# Patient Record
Sex: Female | Born: 1945 | Race: White | Hispanic: No | Marital: Married | Smoking: Never smoker
Health system: Southern US, Community
[De-identification: ages and names within clinical notes are randomized; demographics above are authoritative.]

## PROBLEM LIST (undated history)

## (undated) DIAGNOSIS — H3530 Unspecified macular degeneration: Secondary | ICD-10-CM

## (undated) DIAGNOSIS — N814 Uterovaginal prolapse, unspecified: Secondary | ICD-10-CM

## (undated) DIAGNOSIS — Z87442 Personal history of urinary calculi: Secondary | ICD-10-CM

---

## 1982-02-11 HISTORY — PX: TUBAL LIGATION: SHX77

## 1998-04-04 ENCOUNTER — Other Ambulatory Visit: Admission: RE | Admit: 1998-04-04 | Discharge: 1998-04-04 | Payer: Self-pay | Admitting: Obstetrics and Gynecology

## 1999-08-01 ENCOUNTER — Other Ambulatory Visit: Admission: RE | Admit: 1999-08-01 | Discharge: 1999-08-01 | Payer: Self-pay | Admitting: Gynecology

## 2000-10-20 ENCOUNTER — Other Ambulatory Visit: Admission: RE | Admit: 2000-10-20 | Discharge: 2000-10-20 | Payer: Self-pay | Admitting: Obstetrics and Gynecology

## 2002-08-05 ENCOUNTER — Other Ambulatory Visit: Admission: RE | Admit: 2002-08-05 | Discharge: 2002-08-05 | Payer: Self-pay | Admitting: Obstetrics and Gynecology

## 2004-03-19 ENCOUNTER — Other Ambulatory Visit: Admission: RE | Admit: 2004-03-19 | Discharge: 2004-03-19 | Payer: Self-pay | Admitting: Obstetrics and Gynecology

## 2005-02-11 HISTORY — PX: BREAST ENHANCEMENT SURGERY: SHX7

## 2005-02-11 HISTORY — PX: VAGINAL HYSTERECTOMY: SUR661

## 2005-09-02 ENCOUNTER — Encounter (INDEPENDENT_AMBULATORY_CARE_PROVIDER_SITE_OTHER): Payer: Self-pay | Admitting: *Deleted

## 2005-09-03 ENCOUNTER — Inpatient Hospital Stay (HOSPITAL_COMMUNITY): Admission: RE | Admit: 2005-09-03 | Discharge: 2005-09-04 | Payer: Self-pay | Admitting: Obstetrics and Gynecology

## 2006-07-11 ENCOUNTER — Ambulatory Visit: Payer: Self-pay | Admitting: Internal Medicine

## 2006-07-30 ENCOUNTER — Ambulatory Visit: Payer: Self-pay | Admitting: Internal Medicine

## 2010-02-11 HISTORY — PX: NECK MASS EXCISION: SHX2079

## 2010-06-29 NOTE — Op Note (Signed)
[name], [name]               ACCOUNT NO.:  [id number]   MEDICAL RECORD NO.:  [id number]          PATIENT TYPE:  OIB   LOCATION:  9399                          FACILITY:  [facility]   PHYSICIAN:  Lorra Hals, M.D.   DATE OF BIRTH:  11/05/1945   DATE OF PROCEDURE:  DATE OF DISCHARGE:                                 OPERATIVE REPORT   PREOPERATIVE DIAGNOSIS:  Patient 28 years status post bilateral subglandular  premuscular breast augmentation utilizing smooth shell silicone gel filled  implants with high suspicion for bilateral implant shell deterioration and  left implant extracapsular rupture.   POSTOPERATIVE DIAGNOSES:  1.  Bilateral implant shell degradation/rupture.  2.  Left extracapsular rupture.   OPERATIONS:  1.  Bilateral capsulectomy with removal of implant material.  2.  Subpectoralis major breast augmentation, re-augmentation utilizing      smooth shell saline filled implants.   SURGEON:  Dr. Dub Amis.   ANESTHESIA:  General.   PROCEDURE:  After satisfactory level of general anesthesia was achieved, the  entire anterior chest was prepped with multiple layers of Betadine and a  sterile field created about the anterior chest.   FINDINGS:  This lady presents with bilateral Baker 3 capsule contracture  with discomfort of the breasts bilaterally.  There is a suspicion of a left  extracapsular implant rupture, and high suspicion of bilateral implant shell  degradation/rupture.   The first stage of the procedure consisted of an inferior periareolar  incision bilaterally.  Dissection was taken through the glandular tissue,  down to the periprosthetic capsule.  Next, a circumferential capsulectomy  was carried out.  It should be noted that, along the medial border of the  left implant, there were two separate lobulations of capsule extending where  the periprosthetic capsule had ruptured and silicone gel extruded into these  areas, and had been closed over once again  with a new periprosthetic  capsule.  It also should be noted that both capsules were extremely  calcified in some areas, and extremely fragile and flimsy in other areas,  especially in the areas of the more recent rupture where the implant capsule  was approximately the thickness of Saran Wrap.   Both capsules were then incised.  Both implants were noted to be totally  ruptured, and the entire capsules and silicone gel removed from the  operative field without contamination of the old periprosthetic space with  free silicone gel.   Next, a submuscular subpectoralis major space was created from the second  rib to the newly planned inframammary sulcus and from the parasternal border  to the anterior axillary line.  Hemostasis had been obtained throughout the  procedure with clamps and electrocautery, and hemostasis was rechecked and  felt to be adequate.  The wound cavities were irrigated with a copious  amount of saline on numerous occasions, and hemostasis rechecked and  rechecked with clamps and electrocautery.   Next, a Mentor 275 cc smooth shelled implant was placed bilaterally and,  through a closed system, the right implant was filled to 305 cc and the  slightly larger left breast  implant was filled to 290 cc, and 30 cc of  Betadine solution was placed in each periprosthetic premuscular and  submuscular space.  A 7-mm, Jackson-Pratt drain was placed in the  subglandular premuscular space bilaterally, and brought out through the  lower-outer quadrant.  The muscle and glandular wounds were closed with 3-0  Vicryl, and the skin closed with subcu and running subcu 3-0 Monocryl  augmented by Steri-Strips.  The breasts were dressed in a light compression  dressing.  Patient tolerated the procedure well.  Estimated blood loss from  this portion of the procedure was 100 cc.   ADDENDUM:  It should be noted that, at the same time this procedure was  performed, Dr. Vincente Poli was performing a  vaginal hysterectomy.           ______________________________  Lorra Hals, M.D.     TRK/MEDQ  D:  09/02/2005  T:  09/03/2005  Job:  [id number]   cc:   Lorra Hals, M.D.  Fax: [phone]   Stann Mainland. Vincente Poli, M.D.  Fax: [phone]

## 2010-06-29 NOTE — Discharge Summary (Signed)
NAMENADINA, FOMBY               ACCOUNT NO.:  [id number]   MEDICAL RECORD NO.:  [id number]          PATIENT TYPE:  INP   LOCATION:  9307                          [facility]ACILITY:  [facility]   PHYSICIAN:  Michelle L. Grewal, M.D.DATE OF BIRTH:  16-Jun-1945   DATE OF ADMISSION:  09/02/2005  DATE OF DISCHARGE:  09/04/2005                                 DISCHARGE SUMMARY   ADMISSION DIAGNOSES:  1. Bilateral implant shell degradation rupture.  2. Pelvic relaxation.   HOSPITAL COURSE:  Patient is a 65 year old female who on the day of  admission undergoes a total vaginal hysterectomy, A&P repair for pelvic  prolapse.  At the same time Dr. Dub Amis performed his surgery involving  bilateral breast implants.  The surgery went extremely well.  Estimated  blood loss at the time of hysterectomy was less than 100 mL.  She did very  well postoperative.  On postoperative day #1 hemoglobin was 10.9, white  blood cell count was 13.  She went home on the afternoon of postoperative  day #2, tolerated regular diet, voiding without any difficulty.  She was  discharged home with ibuprofen to take as needed for pain.  She was given  instructions by Dr. Dub Amis in regards to his surgery but I did advise her  in regards to my procedure to avoid any intercourse for six weeks, to call  me immediately if she has a temperature greater than 100.5, any nausea,  vomiting, heavy vaginal bleeding, or inability to go to the bathroom.  She  will follow up in the office in three to four weeks for postoperative check  and she is advised no driving for two weeks.      Michelle L. Vincente Poli, M.D.  Electronically Signed     MLG/MEDQ  D:  10/03/2005  T:  10/03/2005  Job:  [id number]

## 2010-06-29 NOTE — Op Note (Signed)
NAMEJALISIA, [name]               ACCOUNT NO.:  [id number]   MEDICAL RECORD NO.:  [id number]          PATIENT TYPE:  AMB   LOCATION:  SDC                           [facility]ILITY:  [facility]   PHYSICIAN:  Michelle L. Grewal, M.D.DATE OF BIRTH:  1945/10/07   DATE OF PROCEDURE:  09/02/2005  DATE OF DISCHARGE:                                 OPERATIVE REPORT   PREOP DIAGNOSIS:  Pelvic prolapse.   POSTOPERATIVE DIAGNOSIS:  Pelvic prolapse.   PROCEDURES:  1.  Total vaginal hysterectomy.  2.  Anterior posterior repair.   SURGEON:  Michelle L. Vincente Poli, M.D.   ASSISTANT:  Zelphia Cairo, MD   ANESTHESIA:  General   SPECIMENS:  Uterus and cervix.   ESTIMATED BLOOD LOSS:  100 mL.   COMPLICATIONS:  None.   DESCRIPTION OF PROCEDURE:  The patient was taken to the operating room.  She  is intubated without difficulty.  She is then prepped and draped and a Foley  catheter is inserted.  Coincidentally Dr. Marijean Niemann also sequentially  prepped the patient, and that part of the surgery will be dictated by him.  He and I operated together.  A ring weighted speculum was inserted into the  vagina.  The cervix was grasped with a tenaculum and a paracervical incision  was then made.  The posterior cul-de-sac was entered and the anterior cul-de-  sac was then entered sharply.  Using curved Heaney clamps, we walked our way  up along the uterosacral ligament.  Each specimen was cut and suture ligated  using #0 Vicryl suture.   We then reached the __________ with a triple pedicle.  The uterus was  retroflexed, and the curved Heaney clamp was placed across each pedicle.  This specimen was cut and suture ligated using #0 Vicryl suture; and further  secured using a free tie of #0 Vicryl suture.  No bleeding was noted.  Hemostasis was excellent.  The posterior cul-de-sac was closed with a  continuous running stitch using #0 Vicryl suture; and then the cuff was then  closed from front-to-back using 2-0  Vicryl suture.  The patient was noted to  have a small cystocele, grade 1-2 cystocele that was not as prominent after  the hysterectomy.  So, I made a small midline incision in the anterior  vaginal vault, reduced the cystocele with two sutures using #0 Vicryl  suture, this reduced it nicely.  I trimmed off the vaginal epithelium and  closed the anterior vaginal vault using 2-0 Vicryl continuous running  stitch.   At this point, hemostasis was excellent.  We then looked at the rectocele  which was more prominent than the cystocele and I made a V-shaped incision  in the perineum.  I made a midline incision up the posterior vaginal wall,  reduced the rectocele with sharp and blunt dissection, used interrupteds to  reapproximate the rectovaginal fascia, trimmed off the vaginal epithelium;  and then closed the posterior vaginal wall using 2-0 Vicryl in a continuous  running locked stitch.  Vaginal packing with Estrace cream was inserted into  the vagina.  All sponge, lap,  and instrument counts were correct x2.   At this point Dr. Dub Amis was still performing his procedure which will be  dictated by him.  Myself and Dr. Renaldo Fiddler scrubbed out; and we left the  operating room.  At that point the patient was in stable condition in the  hands of Dr. Leodis Binet.   COMPLICATIONS:  None.      Michelle L. Vincente Poli, M.D.  Electronically Signed     MLG/MEDQ  D:  09/02/2005  T:  09/02/2005  Job:  [id number]

## 2010-09-18 ENCOUNTER — Other Ambulatory Visit (HOSPITAL_COMMUNITY)
Admission: RE | Admit: 2010-09-18 | Discharge: 2010-09-18 | Disposition: A | Discharge status: Home / Self Care | Payer: BC Managed Care – PPO | Source: Ambulatory Visit | Attending: Otolaryngology | Admitting: Otolaryngology

## 2010-09-18 DIAGNOSIS — E049 Nontoxic goiter, unspecified: Secondary | ICD-10-CM | POA: Insufficient documentation

## 2011-04-24 ENCOUNTER — Encounter: Payer: Self-pay | Admitting: Internal Medicine

## 2011-12-04 ENCOUNTER — Other Ambulatory Visit: Payer: Self-pay | Admitting: Obstetrics and Gynecology

## 2012-05-15 ENCOUNTER — Encounter: Payer: Self-pay | Admitting: Internal Medicine

## 2012-09-29 DIAGNOSIS — Z0271 Encounter for disability determination: Secondary | ICD-10-CM

## 2012-09-30 ENCOUNTER — Encounter: Payer: Self-pay | Admitting: Internal Medicine

## 2012-10-09 ENCOUNTER — Encounter: Payer: Self-pay | Admitting: Internal Medicine

## 2012-12-16 ENCOUNTER — Other Ambulatory Visit: Payer: Self-pay | Admitting: Obstetrics and Gynecology

## 2013-04-14 ENCOUNTER — Other Ambulatory Visit: Payer: Self-pay | Admitting: Occupational Medicine

## 2013-04-14 ENCOUNTER — Ambulatory Visit: Payer: Self-pay

## 2013-04-14 DIAGNOSIS — R52 Pain, unspecified: Secondary | ICD-10-CM

## 2013-07-16 ENCOUNTER — Encounter: Payer: Self-pay | Admitting: Internal Medicine

## 2013-08-25 ENCOUNTER — Ambulatory Visit (AMBULATORY_SURGERY_CENTER): Payer: Self-pay | Admitting: *Deleted

## 2013-08-25 VITALS — Ht 64.5 in | Wt 149.8 lb

## 2013-08-25 DIAGNOSIS — Z86010 Personal history of colonic polyps: Secondary | ICD-10-CM

## 2013-08-25 MED ORDER — MOVIPREP 100 G PO SOLR: ORAL | Status: DC

## 2013-08-25 NOTE — Progress Notes (Signed)
No allergies to eggs or soy. No problems with anesthesia.  Pt given Emmi instructions for colonoscopy  No oxygen use  No diet drug use  

## 2013-08-26 ENCOUNTER — Encounter: Payer: Self-pay | Admitting: Internal Medicine

## 2013-09-08 ENCOUNTER — Ambulatory Visit (AMBULATORY_SURGERY_CENTER): Payer: BC Managed Care – PPO | Admitting: Internal Medicine

## 2013-09-08 ENCOUNTER — Encounter: Payer: Self-pay | Admitting: Internal Medicine

## 2013-09-08 VITALS — BP 159/83 | HR 81 | Temp 98.2°F | Resp 17 | Ht 64.0 in | Wt 149.0 lb

## 2013-09-08 DIAGNOSIS — Z800 Family history of malignant neoplasm of digestive organs: Secondary | ICD-10-CM

## 2013-09-08 DIAGNOSIS — Z86010 Personal history of colonic polyps: Secondary | ICD-10-CM

## 2013-09-08 MED ORDER — SODIUM CHLORIDE 0.9 % IV SOLN: 500.0000 mL | INTRAVENOUS | Status: DC

## 2013-09-08 NOTE — Progress Notes (Signed)
Report to PACU, RN, vss, BBS= Clear.  

## 2013-09-08 NOTE — Patient Instructions (Signed)
Discharge instructions given with verbal understanding. Handouts on diverticulosis and a high fiber diet. Resume previous diet. YOU HAD AN ENDOSCOPIC PROCEDURE TODAY AT THE [facility]: Refer to the procedure report that was given to you for any specific questions about what was found during the examination.  If the procedure report does not answer your questions, please call your gastroenterologist to clarify.  If you requested that your care partner not be given the details of your procedure findings, then the procedure report has been included in a sealed envelope for you to review at your convenience later.  YOU SHOULD EXPECT: Some feelings of bloating in the abdomen. Passage of more gas than usual.  Walking can help get rid of the air that was put into your GI tract during the procedure and reduce the bloating. If you had a lower endoscopy (such as a colonoscopy or flexible sigmoidoscopy) you may notice spotting of blood in your stool or on the toilet paper. If you underwent a bowel prep for your procedure, then you may not have a normal bowel movement for a few days.  DIET: Your first meal following the procedure should be a light meal and then it is ok to progress to your normal diet.  A half-sandwich or bowl of soup is an example of a good first meal.  Heavy or fried foods are harder to digest and may make you feel nauseous or bloated.  Likewise meals heavy in dairy and vegetables can cause extra gas to form and this can also increase the bloating.  Drink plenty of fluids but you should avoid alcoholic beverages for 24 hours.  ACTIVITY: Your care partner should take you home directly after the procedure.  You should plan to take it easy, moving slowly for the rest of the day.  You can resume normal activity the day after the procedure however you should NOT DRIVE or use heavy machinery for 24 hours (because of the sedation medicines used during the test).    SYMPTOMS TO REPORT  IMMEDIATELY: A gastroenterologist can be reached at any hour.  During normal business hours, 8:30 AM to 5:00 PM Monday through Friday, call [phone].  After hours and on weekends, please call the GI answering service at [phone] who will take a message and have the physician on call contact you.   Following lower endoscopy (colonoscopy or flexible sigmoidoscopy):  Excessive amounts of blood in the stool  Significant tenderness or worsening of abdominal pains  Swelling of the abdomen that is new, acute  Fever of 100F or higher  FOLLOW UP: If any biopsies were taken you will be contacted by phone or by letter within the next 1-3 weeks.  Call your gastroenterologist if you have not heard about the biopsies in 3 weeks.  Our staff will call the home number listed on your records the next business day following your procedure to check on you and address any questions or concerns that you may have at that time regarding the information given to you following your procedure. This is a courtesy call and so if there is no answer at the home number and we have not heard from you through the emergency physician on call, we will assume that you have returned to your regular daily activities without incident.  SIGNATURES/CONFIDENTIALITY: You and/or your care partner have signed paperwork which will be entered into your electronic medical record.  These signatures attest to the fact that that the information above on your  After Visit Summary has been reviewed and is understood.  Full responsibility of the confidentiality of this discharge information lies with you and/or your care-partner.

## 2013-09-08 NOTE — Op Note (Addendum)
[facility] [address] [place], [id number]   COLONOSCOPY PROCEDURE REPORT  PATIENT: [name], Felicia C.  MR#: [id number] BIRTHDATE: [birth year] , 67  yrs. old GENDER: Female ENDOSCOPIST: Hart Carwinora M Amad Mau, MD REFERRED NW:GNFAOZHYBY:Michelle Vincente PoliGrewal, M.D. PROCEDURE DATE:  09/08/2013 PROCEDURE:   Colonoscopy, screening First Screening Colonoscopy - Avg.  risk and is 50 yrs.  old or older - No.  Prior Negative Screening - Now for repeat screening. average risk  History of Adenoma - Now for follow-up colonoscopy  has been > or = to 3 yrs.  N/A  Polyps Removed Today? No. Recommend repeat exam, <10 yrs? no ASA CLASS:   Class II INDICATIONS:last colonoscopy June 2008 was normal. MEDICATIONS: MAC sedation, administered by CRNA and Propofol (Diprivan) 240 mg IV  DESCRIPTION OF PROCEDURE:   After the risks benefits and alternatives of the procedure were thoroughly explained, informed consent was obtained.  A digital rectal exam revealed no abnormalities of the rectum.   The LB PFC-H190 [id number]  endoscope was introduced through the anus and advanced to the cecum, which was identified by both the appendix and ileocecal valve. No adverse events experienced.   The quality of the prep was good, using MoviPrep  The instrument was then slowly withdrawn as the colon was fully examined.      COLON FINDINGS: Moderate diverticulosis was noted in the sigmoid colon, descending colon, and ascending colon.  Retroflexed views revealed no abnormalities. The time to cecum=3 minutes 12 seconds. Withdrawal time=7 minutes 13 seconds.  The scope was withdrawn and the procedure completed. COMPLICATIONS: There were no complications.  ENDOSCOPIC IMPRESSION: Moderate diverticulosis was noted in the sigmoid colon, descending colon, and ascending colon  RECOMMENDATIONS: high fiber diet Fiber supplements such as Metamucil or Benefiber daily Recall colonoscopy in 10 years   eSigned:  Hart Carwinora M Ramanda Paules, MD  09/08/2013 11:53 AM Revised: 09/08/2013 11:53 AM  cc:   PATIENT NAME:  [name], Felicia C. MR#: [id number]

## 2013-09-09 ENCOUNTER — Telehealth: Payer: Self-pay | Admitting: *Deleted

## 2013-09-09 NOTE — Telephone Encounter (Signed)
  Follow up Call-  Call back number 09/08/2013  Post procedure Call Back phone  # [phone]  Permission to leave phone message Yes     Patient questions:  Do you have a fever, pain , or abdominal swelling? No. Pain Score  0 *  Have you tolerated food without any problems? Yes.    Have you been able to return to your normal activities? Yes.    Do you have any questions about your discharge instructions: Diet   No. Medications  No. Follow up visit  No.  Do you have questions or concerns about your Care? No.  Actions: * If pain score is 4 or above: No action needed, pain <4.

## 2013-12-21 ENCOUNTER — Other Ambulatory Visit: Payer: Self-pay | Admitting: Obstetrics and Gynecology

## 2013-12-22 LAB — CYTOLOGY - PAP

## 2014-06-14 ENCOUNTER — Ambulatory Visit (INDEPENDENT_AMBULATORY_CARE_PROVIDER_SITE_OTHER): Payer: BC Managed Care – PPO | Admitting: Family Medicine

## 2014-06-14 VITALS — BP 124/84 | HR 77 | Temp 98.2°F | Resp 18 | Ht 64.0 in | Wt 146.2 lb

## 2014-06-14 DIAGNOSIS — L03113 Cellulitis of right upper limb: Secondary | ICD-10-CM | POA: Diagnosis not present

## 2014-06-14 DIAGNOSIS — T63301A Toxic effect of unspecified spider venom, accidental (unintentional), initial encounter: Secondary | ICD-10-CM | POA: Diagnosis not present

## 2014-06-14 MED ORDER — DOXYCYCLINE HYCLATE 100 MG PO TABS: 100.0000 mg | ORAL_TABLET | Freq: Two times a day (BID) | ORAL | Status: DC

## 2014-06-14 NOTE — Patient Instructions (Signed)
Start doxycycline as this may be an spider or insect bite that is showing some possible signs of infection.  Benadryl up to every 4 to 6 hours as needed until swelling and itching improved.  If spread of redness or worsening tomorrow- return for recheck.   Spider Bite Spider bites are not common. Most spider bites do not cause serious problems. The elderly, very young children, and people with certain existing medical conditions are more likely to experience significant symptoms. SYMPTOMS  Spider bites may not cause any pain at first. Within 1 or 2 days of the bite, there may be swelling, redness, and pain in the bite area. However, some spider bites can cause pain within the first hour. TREATMENT  Your caregiver may prescribe antibiotic medicine if a bacterial infection develops in the bite. However, not all spider bites require antibiotics or prescription medicines.  HOME CARE INSTRUCTIONS  Do not scratch the bite area.  Keep the bite area clean and dry. Wash the area with soap and water as directed.  Put ice or cool compresses on the bite area.  Put ice in a plastic bag.  Place a towel between your skin and the bag.  Leave the ice on for 20 minutes, 4 times a day for the first 2 to 3 days, or as directed.  Keep the bite area elevated above the level of your heart. This helps reduce redness and swelling.  Only take over-the-counter or prescription medicines as directed by your caregiver.  If you are given antibiotics, take them as directed. Finish them even if you start to feel better. You may need a tetanus shot if:  You cannot remember when you had your last tetanus shot.  You have never had a tetanus shot.  The injury broke your skin. If you get a tetanus shot, your arm may swell, get red, and feel warm to the touch. This is common and not a problem. If you need a tetanus shot and you choose not to have one, there is a rare chance of getting tetanus. Sickness from tetanus can  be serious. SEEK MEDICAL CARE IF: Your bite is not better after 3 days of treatment. SEEK IMMEDIATE MEDICAL CARE IF:  Your bite turns purple or develops increased swelling, pain, or redness.  You develop shortness of breath or chest pain.  You have muscle cramps or painful muscle spasms.  You develop abdominal pain, nausea, or vomiting.  You feel unusually tired or sleepy. MAKE SURE YOU:  Understand these instructions.  Will watch your condition.  Will get help right away if you are not doing well or get worse. Document Released: 03/07/2004 Document Revised: 04/22/2011 Document Reviewed: 08/29/2010 [facility] Patient Information 2015 [employer] [place]. This information is not intended to replace advice given to you by your health care provider. Make sure you discuss any questions you have with your health care provider.   Cellulitis Cellulitis is an infection of the skin and the tissue beneath it. The infected area is usually red and tender. Cellulitis occurs most often in the arms and lower legs.  CAUSES  Cellulitis is caused by bacteria that enter the skin through cracks or cuts in the skin. The most common types of bacteria that cause cellulitis are staphylococci and streptococci. SIGNS AND SYMPTOMS   Redness and warmth.  Swelling.  Tenderness or pain.  Fever. DIAGNOSIS  Your health care provider can usually determine what is wrong based on a physical exam. Blood tests may also be done. TREATMENT  Treatment usually involves taking an antibiotic medicine. HOME CARE INSTRUCTIONS   Take your antibiotic medicine as directed by your health care provider. Finish the antibiotic even if you start to feel better.  Keep the infected arm or leg elevated to reduce swelling.  Apply a warm cloth to the affected area up to 4 times per day to relieve pain.  Take medicines only as directed by your health care provider.  Keep all follow-up visits as directed by your health care  provider. SEEK MEDICAL CARE IF:   You notice red streaks coming from the infected area.  Your red area gets larger or turns dark in color.  Your bone or joint underneath the infected area becomes painful after the skin has healed.  Your infection returns in the same area or another area.  You notice a swollen bump in the infected area.  You develop new symptoms.  You have a fever. SEEK IMMEDIATE MEDICAL CARE IF:   You feel very sleepy.  You develop vomiting or diarrhea.  You have a general ill feeling (malaise) with muscle aches and pains. MAKE SURE YOU:   Understand these instructions.  Will watch your condition.  Will get help right away if you are not doing well or get worse. Document Released: 11/07/2004 Document Revised: 06/14/2013 Document Reviewed: 04/15/2011 [facility] Patient Information 2015 [place], [place]. This information is not intended to replace advice given to you by your health care provider. Make sure you discuss any questions you have with your health care provider.

## 2014-06-14 NOTE — Progress Notes (Addendum)
Subjective:  This chart was scribed for Meredith Staggers MD, by Veverly Fells, at [facility].  This patient was seen in room 11 and the patient's care was started at 11:30 AM.    Patient ID: [name], female    DOB: [birth year], 69 y.o.   MRN: [id number]  HPI  HPI Comments: [name] is a 69 y.o. female who presents to the [facility] complaining of right hand itching and swelling with redness onset yesterday at 2:15 pm. She states that the itching/swelling has increased today.  She has associated symtoms of soreness on her inner part of her arm.Patient notes that the redness was at the knuckle area when her symptoms first started and has now spread to her wrist and fingers.  Patient was picking up/burning boxes around 12:30 pm yesterday and states that she saw a spider at the bottom of the box but denies feeling any bites.  Patient put neosporin and benadryl cream one time to alleviate her symptoms but denies any relief. She denies , fevers, nausea or vomitting.  She does not remember the date of her last tetanus shot but thinks it may have been two years ago in June.  She has no other complaints today.    There are no active problems to display for this patient.  History reviewed. No pertinent past medical history. Past Surgical History  Procedure Laterality Date  . Tubal ligation  1984  . Vaginal hysterectomy  2007  . Breast enhancement surgery  2007  . Neck mass excision  2012   No Known Allergies Prior to Admission medications   Medication Sig Start Date End Date Taking? Authorizing Provider  Calcium-Phosphorus-Vitamin D (CALCIUM GUMMIES PO) Take 2 capsules by mouth daily.   Yes Historical Provider, MD  Cholecalciferol (VITAMIN D PO) Take by mouth daily.   Yes Historical Provider, MD  Coenzyme Q10 (CO Q 10 PO) Take by mouth daily.   Yes Historical Provider, MD  multivitamin-lutein (OCUVITE-LUTEIN) CAPS capsule Take 1 capsule  by mouth daily.   Yes Historical Provider, MD  Omega-3 Fatty Acids (OMEGA 3 PO) Take by mouth daily.   Yes Historical Provider, MD   History   Social History  . Marital Status: Married    Spouse Name: N/A  . Number of Children: N/A  . Years of Education: N/A   Occupational History  . Not on file.   Social History Main Topics  . Smoking status: Never Smoker   . Smokeless tobacco: Never Used  . Alcohol Use: No  . Drug Use: No  . Sexual Activity: Not on file   Other Topics Concern  . Not on file   Social History Narrative       Review of Systems  Constitutional: Negative for fever and chills.  Respiratory: Negative for wheezing.   Cardiovascular: Negative for chest pain.  Gastrointestinal: Negative for nausea and vomiting.  Skin: Positive for rash and wound.       Objective:   Physical Exam  Constitutional: She appears well-developed and well-nourished. No distress.  HENT:  Head: Normocephalic and atraumatic.  Eyes: Right eye exhibits no discharge. Left eye exhibits no discharge.  Pulmonary/Chest: Effort normal. No respiratory distress.  Musculoskeletal:  Elbow is full range of motion.  There is no axillary lymphadenopathy.    Neurological: She is alert. Coordination normal.  Skin: She is not diaphoretic.  dorsum of her right hand, base of the third finger, appears to  be a small would that also appears to have two small punctures with small papule laterally.    8 cm of erythema and soft tissue swelling on dorsum of hand by about 10 cm of right hand from dorsal wrist to proximal finger 2-5.  There is no proximal erythamatous streaking coming from the arm.      Psychiatric: She has a normal mood and affect. Her behavior is normal.  Nursing note and vitals reviewed.   Filed Vitals:   06/14/14 1118  BP: 124/84  Pulse: 77  Temp: 98.2 F (36.8 C)  TempSrc: Oral  Resp: 18  Height: 5\' 4"  (1.626 m)  Weight: 146 lb 3.2 oz (66.316 kg)  SpO2: 91%         Assessment & Plan:   [name] is a 69 y.o. female Spider bite, accidental or unintentional, initial encounter - Plan: doxycycline (VIBRA-TABS) 100 MG tablet  Cellulitis of right upper extremity - Plan: doxycycline (VIBRA-TABS) 100 MG tablet  Suspected spider bite with swelling/inflammation from bite, but worsening redness this morning - possible cellulitis.   -benadryl otc Q4-6hr prn, then as itching and swelling improves- can change to allegra otc QD.   -doxycycline 100mg  BID.   -sx care, rtc precautions discussed.   Meds ordered this encounter  Medications  . doxycycline (VIBRA-TABS) 100 MG tablet    Sig: Take 1 tablet (100 mg total) by mouth 2 (two) times daily.    Dispense:  20 tablet    Refill:  0   Patient Instructions  Start doxycycline as this may be an spider or insect bite that is showing some possible signs of infection.  Benadryl up to every 4 to 6 hours as needed until swelling and itching improved.  If spread of redness or worsening tomorrow- return for recheck.   Spider Bite Spider bites are not common. Most spider bites do not cause serious problems. The elderly, very young children, and people with certain existing medical conditions are more likely to experience significant symptoms. SYMPTOMS  Spider bites may not cause any pain at first. Within 1 or 2 days of the bite, there may be swelling, redness, and pain in the bite area. However, some spider bites can cause pain within the first hour. TREATMENT  Your caregiver may prescribe antibiotic medicine if a bacterial infection develops in the bite. However, not all spider bites require antibiotics or prescription medicines.  HOME CARE INSTRUCTIONS  Do not scratch the bite area.  Keep the bite area clean and dry. Wash the area with soap and water as directed.  Put ice or cool compresses on the bite area.  Put ice in a plastic bag.  Place a towel between your skin and the bag.  Leave the ice on for 20  minutes, 4 times a day for the first 2 to 3 days, or as directed.  Keep the bite area elevated above the level of your heart. This helps reduce redness and swelling.  Only take over-the-counter or prescription medicines as directed by your caregiver.  If you are given antibiotics, take them as directed. Finish them even if you start to feel better. You may need a tetanus shot if:  You cannot remember when you had your last tetanus shot.  You have never had a tetanus shot.  The injury broke your skin. If you get a tetanus shot, your arm may swell, get red, and feel warm to the touch. This is common and not a problem. If you  need a tetanus shot and you choose not to have one, there is a rare chance of getting tetanus. Sickness from tetanus can be serious. SEEK MEDICAL CARE IF: Your bite is not better after 3 days of treatment. SEEK IMMEDIATE MEDICAL CARE IF:  Your bite turns purple or develops increased swelling, pain, or redness.  You develop shortness of breath or chest pain.  You have muscle cramps or painful muscle spasms.  You develop abdominal pain, nausea, or vomiting.  You feel unusually tired or sleepy. MAKE SURE YOU:  Understand these instructions.  Will watch your condition.  Will get help right away if you are not doing well or get worse. Document Released: 03/07/2004 Document Revised: 04/22/2011 Document Reviewed: 08/29/2010 [facility] Patient Information 2015 [place], [place]. This information is not intended to replace advice given to you by your health care provider. Make sure you discuss any questions you have with your health care provider.   Cellulitis Cellulitis is an infection of the skin and the tissue beneath it. The infected area is usually red and tender. Cellulitis occurs most often in the arms and lower legs.  CAUSES  Cellulitis is caused by bacteria that enter the skin through cracks or cuts in the skin. The most common types of bacteria that cause  cellulitis are staphylococci and streptococci. SIGNS AND SYMPTOMS   Redness and warmth.  Swelling.  Tenderness or pain.  Fever. DIAGNOSIS  Your health care provider can usually determine what is wrong based on a physical exam. Blood tests may also be done. TREATMENT  Treatment usually involves taking an antibiotic medicine. HOME CARE INSTRUCTIONS   Take your antibiotic medicine as directed by your health care provider. Finish the antibiotic even if you start to feel better.  Keep the infected arm or leg elevated to reduce swelling.  Apply a warm cloth to the affected area up to 4 times per day to relieve pain.  Take medicines only as directed by your health care provider.  Keep all follow-up visits as directed by your health care provider. SEEK MEDICAL CARE IF:   You notice red streaks coming from the infected area.  Your red area gets larger or turns dark in color.  Your bone or joint underneath the infected area becomes painful after the skin has healed.  Your infection returns in the same area or another area.  You notice a swollen bump in the infected area.  You develop new symptoms.  You have a fever. SEEK IMMEDIATE MEDICAL CARE IF:   You feel very sleepy.  You develop vomiting or diarrhea.  You have a general ill feeling (malaise) with muscle aches and pains. MAKE SURE YOU:   Understand these instructions.  Will watch your condition.  Will get help right away if you are not doing well or get worse. Document Released: 11/07/2004 Document Revised: 06/14/2013 Document Reviewed: 04/15/2011 [facility] Patient Information 2015 [place], [place]. This information is not intended to replace advice given to you by your health care provider. Make sure you discuss any questions you have with your health care provider.      I personally performed the services described in this documentation, which was scribed in my presence. The recorded information has been  reviewed and considered, and addended by me as needed.

## 2014-10-10 ENCOUNTER — Emergency Department (HOSPITAL_COMMUNITY)
Admission: EM | Admit: 2014-10-10 | Discharge: 2014-10-10 | Disposition: A | Discharge status: Home / Self Care | Payer: BC Managed Care – PPO | Attending: Emergency Medicine | Admitting: Emergency Medicine

## 2014-10-10 ENCOUNTER — Emergency Department (HOSPITAL_COMMUNITY): Payer: BC Managed Care – PPO

## 2014-10-10 ENCOUNTER — Encounter (HOSPITAL_COMMUNITY): Payer: Self-pay | Admitting: Emergency Medicine

## 2014-10-10 DIAGNOSIS — Z79899 Other long term (current) drug therapy: Secondary | ICD-10-CM | POA: Diagnosis not present

## 2014-10-10 DIAGNOSIS — N201 Calculus of ureter: Secondary | ICD-10-CM | POA: Diagnosis not present

## 2014-10-10 DIAGNOSIS — R109 Unspecified abdominal pain: Secondary | ICD-10-CM | POA: Diagnosis present

## 2014-10-10 LAB — COMPREHENSIVE METABOLIC PANEL
ALBUMIN: 4.4 g/dL (ref 3.5–5.0)
ALT: 25 U/L (ref 14–54)
AST: 56 U/L — AB (ref 15–41)
Alkaline Phosphatase: 59 U/L (ref 38–126)
Anion gap: 11 (ref 5–15)
BUN: 17 mg/dL (ref 6–20)
CHLORIDE: 108 mmol/L (ref 101–111)
CO2: 23 mmol/L (ref 22–32)
Calcium: 9.6 mg/dL (ref 8.9–10.3)
Creatinine, Ser: 0.82 mg/dL (ref 0.44–1.00)
GFR calc Af Amer: >60 mL/min (ref >60)
Glucose, Bld: 103 mg/dL — ABNORMAL HIGH (ref 65–99)
POTASSIUM: 3.2 mmol/L — AB (ref 3.5–5.1)
Sodium: 142 mmol/L (ref 135–145)
Total Bilirubin: 1 mg/dL (ref 0.3–1.2)
Total Protein: 7.4 g/dL (ref 6.5–8.1)

## 2014-10-10 LAB — CBC WITH DIFFERENTIAL/PLATELET
BASOS ABS: 0 10*3/uL (ref 0.0–0.1)
Basophils Relative: 1 % (ref 0–1)
Eosinophils Absolute: 0.1 10*3/uL (ref 0.0–0.7)
Eosinophils Relative: 1 % (ref 0–5)
HEMATOCRIT: 41 % (ref 36.0–46.0)
HEMOGLOBIN: 14 g/dL (ref 12.0–15.0)
LYMPHS PCT: 44 % (ref 12–46)
Lymphs Abs: 2.8 10*3/uL (ref 0.7–4.0)
MCH: 31.3 pg (ref 26.0–34.0)
MCHC: 34.1 g/dL (ref 30.0–36.0)
MCV: 91.5 fL (ref 78.0–100.0)
MONO ABS: 0.5 10*3/uL (ref 0.1–1.0)
Monocytes Relative: 7 % (ref 3–12)
NEUTROS ABS: 3.1 10*3/uL (ref 1.7–7.7)
NEUTROS PCT: 47 % (ref 43–77)
Platelets: 250 10*3/uL (ref 150–400)
RBC: 4.48 MIL/uL (ref 3.87–5.11)
RDW: 12.5 % (ref 11.5–15.5)
WBC: 6.5 10*3/uL (ref 4.0–10.5)

## 2014-10-10 LAB — URINE MICROSCOPIC-ADD ON

## 2014-10-10 LAB — URINALYSIS, ROUTINE W REFLEX MICROSCOPIC
Bilirubin Urine: NEGATIVE
GLUCOSE, UA: NEGATIVE mg/dL
Ketones, ur: 15 mg/dL — AB
NITRITE: NEGATIVE
PROTEIN: 30 mg/dL — AB
Specific Gravity, Urine: 1.028 (ref 1.005–1.030)
Urobilinogen, UA: 0.2 mg/dL (ref 0.0–1.0)
pH: 6 (ref 5.0–8.0)

## 2014-10-10 MED ORDER — OXYCODONE-ACETAMINOPHEN 5-325 MG PO TABS
ORAL_TABLET | ORAL | Status: AC
  Filled 2014-10-10: qty 1

## 2014-10-10 MED ORDER — TAMSULOSIN HCL 0.4 MG PO CAPS
0.4000 mg | ORAL_CAPSULE | Freq: Every day | ORAL | Status: DC
Start: 2014-10-10 — End: 2016-03-27

## 2014-10-10 MED ORDER — ONDANSETRON 4 MG PO TBDP
4.0000 mg | ORAL_TABLET | Freq: Once | ORAL | Status: AC | PRN
  Administered 2014-10-10: 4 mg via ORAL

## 2014-10-10 MED ORDER — TAMSULOSIN HCL 0.4 MG PO CAPS
0.4000 mg | ORAL_CAPSULE | Freq: Once | ORAL | Status: AC
  Administered 2014-10-10: 0.4 mg via ORAL
  Filled 2014-10-10: qty 1

## 2014-10-10 MED ORDER — ONDANSETRON 4 MG PO TBDP
ORAL_TABLET | ORAL | Status: AC
  Filled 2014-10-10: qty 1

## 2014-10-10 MED ORDER — ONDANSETRON 4 MG PO TBDP: 4.0000 mg | ORAL_TABLET | Freq: Three times a day (TID) | ORAL | Status: DC | PRN

## 2014-10-10 MED ORDER — OXYCODONE-ACETAMINOPHEN 5-325 MG PO TABS: 1.0000 | ORAL_TABLET | ORAL | Status: DC | PRN

## 2014-10-10 MED ORDER — OXYCODONE-ACETAMINOPHEN 5-325 MG PO TABS
1.0000 | ORAL_TABLET | Freq: Once | ORAL | Status: AC
  Administered 2014-10-10: 1 via ORAL

## 2014-10-10 NOTE — ED Notes (Signed)
Pt c/o left sided flank pain starting today; pt sts nausea from pain; pt hyperventilating at present due to pain

## 2014-10-10 NOTE — ED Provider Notes (Signed)
CSN: [id number]     Arrival date & time 10/10/14  1215 History   First MD Initiated Contact with Patient 10/10/14 1711     Chief Complaint  Patient presents with  . Flank Pain     (Consider location/radiation/quality/duration/timing/severity/associated sxs/prior Treatment) HPI Comments: 69 year old female who presents with left flank pain. The patient states that at approximately 11:15 AM today, she had a sudden onset of left flank pain that feels like it cuts through from front to back. The pain is constant and was severe. She took ibuprofen at home and shortly after began having nausea and vomiting. She's never had this pain before. She denies any dysuria, hematuria, diarrhea, blood in her stool, or abdominal pain. No fevers, chest pain, or shortness of breath. She received a pain pill in triage and states that her pain and nausea are controlled at this time.  Patient is a 69 y.o. female presenting with flank pain. The history is provided by the patient.  Flank Pain    History reviewed. No pertinent past medical history. Past Surgical History  Procedure Laterality Date  . Tubal ligation  1984  . Vaginal hysterectomy  2007  . Breast enhancement surgery  2007  . Neck mass excision  2012   Family History  Problem Relation Age of Onset  . Colon cancer Neg Hx   . Stroke Father    Social History  Substance Use Topics  . Smoking status: Never Smoker   . Smokeless tobacco: Never Used  . Alcohol Use: No   OB History    No data available     Review of Systems  Genitourinary: Positive for flank pain.    10 Systems reviewed and are negative for acute change except as noted in the HPI.   Allergies  Review of patient's allergies indicates no known allergies.  Home Medications   Prior to Admission medications   Medication Sig Start Date End Date Taking? Authorizing Provider  Calcium-Phosphorus-Vitamin D (CALCIUM GUMMIES PO) Take 2 capsules by mouth daily.   Yes Historical  Provider, MD  Cholecalciferol (VITAMIN D PO) Take by mouth daily.   Yes Historical Provider, MD  Coenzyme Q10 (CO Q 10 PO) Take by mouth daily.   Yes Historical Provider, MD  Cyanocobalamin (VITAMIN B 12 PO) Take 1 tablet by mouth daily.   Yes Historical Provider, MD  multivitamin-lutein (OCUVITE-LUTEIN) CAPS capsule Take 1 capsule by mouth daily.   Yes Historical Provider, MD  Omega-3 Fatty Acids (OMEGA 3 PO) Take by mouth daily.   Yes Historical Provider, MD  doxycycline (VIBRA-TABS) 100 MG tablet Take 1 tablet (100 mg total) by mouth 2 (two) times daily. Patient not taking: Reported on 10/10/2014 06/14/14   Shade Flood, MD   BP 143/84 mmHg  Pulse 60  Temp(Src) 97.3 F (36.3 C) (Oral)  Resp 18  SpO2 99% Physical Exam  Constitutional: She is oriented to person, place, and time. She appears well-developed and well-nourished. No distress.  HENT:  Head: Normocephalic and atraumatic.  Moist mucous membranes  Eyes: Conjunctivae are normal. Pupils are equal, round, and reactive to light.  Neck: Neck supple.  Cardiovascular: Normal rate, regular rhythm and normal heart sounds.   No murmur heard. Pulmonary/Chest: Effort normal and breath sounds normal.  Abdominal: Soft. Bowel sounds are normal. She exhibits no distension. There is no tenderness.  Genitourinary:  No CVA tenderness  Musculoskeletal: She exhibits no edema.  Neurological: She is alert and oriented to person, place, and time.  Fluent  speech  Skin: Skin is warm and dry.  Psychiatric: She has a normal mood and affect. Judgment normal.  Nursing note and vitals reviewed.   ED Course  Procedures (including critical care time) Labs Review Labs Reviewed  COMPREHENSIVE METABOLIC PANEL - Abnormal; Notable for the following:    Potassium 3.2 (*)    Glucose, Bld 103 (*)    AST 56 (*)    All other components within normal limits  URINALYSIS, ROUTINE W REFLEX MICROSCOPIC (NOT AT [facility]) - Abnormal; Notable for the following:     APPearance CLOUDY (*)    Hgb urine dipstick LARGE (*)    Ketones, ur 15 (*)    Protein, ur 30 (*)    Leukocytes, UA MODERATE (*)    All other components within normal limits  URINE MICROSCOPIC-ADD ON - Abnormal; Notable for the following:    Squamous Epithelial / LPF FEW (*)    All other components within normal limits  CBC WITH DIFFERENTIAL/PLATELET    Imaging Review Ct Abdomen Pelvis Wo Contrast  10/10/2014   CLINICAL DATA:  Left flank pain  EXAM: CT ABDOMEN AND PELVIS WITHOUT CONTRAST  TECHNIQUE: Multidetector CT imaging of the abdomen and pelvis was performed following the standard protocol without IV contrast.  COMPARISON:  None.  FINDINGS: Lower chest: Motion at the lung bases degrades imaging. Breast implants partly visualized. Left lower lobe subpleural atelectasis or scarring with left lower lobe bronchiectasis noted. No measurable nodule or mass. No pleural effusion.  Hepatobiliary: Too small to characterize 6 mm hypodense lesion at the dome of the right hepatic lobe image 10. Liver otherwise unremarkable. Normal gallbladder.  Pancreas: Normal  Spleen: Normal  Adrenals/Urinary Tract: Adrenal glands appear normal. Mild left hydroureteronephrosis is noted to the level of a 4 mm proximal left ureteral calculus image 38. No radiopaque bladder, right ureteral, or right renal calculus.  Stomach/Bowel: Stomach appears normal. No bowel wall thickening or focal segmental dilatation is identified. Normal appendix. Colonic diverticuli noted without evidence for diverticulitis.  Vascular/Lymphatic: Moderate atheromatous aortic calcification without aneurysm. No lymphadenopathy.  Other: No ascites or free air. There is mild haziness at the root of the small bowel mesentery, image 33 series 2, without measurable lymphadenopathy or mass.  No free air or fluid.  Evidence of hysterectomy.  Ovaries appear normal.  Musculoskeletal: No acute osseous abnormality.  IMPRESSION: 4 mm proximal left ureteral calculus  producing mild proximal left hydroureteronephrosis.  Nonspecific haziness at the root of the small bowel mesentery which may be an incidental finding but is occasionally seen with lymphoproliferative disorders.   Electronically Signed   By: Christiana Pellant M.D.   On: 10/10/2014 18:42   I have personally reviewed and evaluated these lab results as part of my medical decision-making.   EKG Interpretation None     Medications  ondansetron (ZOFRAN-ODT) 4 MG disintegrating tablet (not administered)  oxyCODONE-acetaminophen (PERCOCET/ROXICET) 5-325 MG per tablet (not administered)  tamsulosin (FLOMAX) capsule 0.4 mg (not administered)  ondansetron (ZOFRAN-ODT) disintegrating tablet 4 mg (4 mg Oral Given 10/10/14 1303)  oxyCODONE-acetaminophen (PERCOCET/ROXICET) 5-325 MG per tablet 1 tablet (1 tablet Oral Given 10/10/14 1303)    MDM   Final diagnoses:  None  4mm left ureteral stone    69 year old female who presents with sudden onset of left-sided flank pain associated with vomiting. At presentation, the patient was awake, alert, uncomfortable but in no acute distress. Vital signs unremarkable. No CVA tenderness or abdominal tenderness on exam. Obtained above lab work as well  as noncontrasted CT of the abdomen and pelvis to evaluate for ureteral stone.  Labs show a normal creatinine, UA containing large amount of blood, some ketones and protein. CT showed a 4 mm proximal left ureteral stone with mild left hydronephrosis. On reexamination, the patient was eating a [employer] sandwich and stated that her pain was minimal. Gave her dose of Flomax in the emergency department and reviewed supportive care instructions at home including pain medications, antiemetics, Flomax, and hydration. We will provide follow-up information with urology. I have extensively reviewed return precautions including intractable vomiting, uncontrolled pain, or fever and the patient has voiced understanding. Patient discharged in  satisfactory condition.  Laurence Spates, MD 10/10/14 [phone]

## 2014-10-10 NOTE — Discharge Instructions (Signed)

## 2014-11-25 ENCOUNTER — Other Ambulatory Visit: Payer: Self-pay | Admitting: Urology

## 2016-03-27 ENCOUNTER — Ambulatory Visit (INDEPENDENT_AMBULATORY_CARE_PROVIDER_SITE_OTHER): Payer: BC Managed Care – PPO | Admitting: Emergency Medicine

## 2016-03-27 VITALS — BP 148/82 | HR 99 | Temp 98.8°F | Resp 16 | Ht 64.0 in | Wt 150.0 lb

## 2016-03-27 DIAGNOSIS — J0100 Acute maxillary sinusitis, unspecified: Secondary | ICD-10-CM

## 2016-03-27 DIAGNOSIS — R0981 Nasal congestion: Secondary | ICD-10-CM

## 2016-03-27 MED ORDER — AMOXICILLIN-POT CLAVULANATE 875-125 MG PO TABS: 1.0000 | ORAL_TABLET | Freq: Two times a day (BID) | ORAL | 0 refills | Status: DC

## 2016-03-27 NOTE — Patient Instructions (Addendum)
     IF you received an x-ray today, you will receive an invoice from [facility]. Please contact [facility] at [phone] with questions or concerns regarding your invoice.   IF you received labwork today, you will receive an invoice from [facility]. Please contact [facility] at [phone] with questions or concerns regarding your invoice.   Our billing staff will not be able to assist you with questions regarding bills from these companies.  You will be contacted with the lab results as soon as they are available. The fastest way to get your results is to activate your My Chart account. Instructions are located on the last page of this paperwork. If you have not heard from us regarding the results in 2 weeks, please contact this office.      Sinusitis, Adult Sinusitis is soreness and inflammation of your sinuses. Sinuses are hollow spaces in the bones around your face. They are located:  Around your eyes.  In the middle of your forehead.  Behind your nose.  In your cheekbones. Your sinuses and nasal passages are lined with a stringy fluid (mucus). Mucus normally drains out of your sinuses. When your nasal tissues get inflamed or swollen, the mucus can get trapped or blocked so air cannot flow through your sinuses. This lets bacteria, viruses, and funguses grow, and that leads to infection. Follow these instructions at home: Medicines   Take, use, or apply over-the-counter and prescription medicines only as told by your doctor. These may include nasal sprays.  If you were prescribed an antibiotic medicine, take it as told by your doctor. Do not stop taking the antibiotic even if you start to feel better. Hydrate and Humidify   Drink enough water to keep your pee (urine) clear or pale yellow.  Use a cool mist humidifier to keep the humidity level in your home above 50%.  Breathe in steam for 10-15 minutes, 3-4 times a day or as told by your doctor. You can do  this in the bathroom while a hot shower is running.  Try not to spend time in cool or dry air. Rest   Rest as much as possible.  Sleep with your head raised (elevated).  Make sure to get enough sleep each night. General instructions   Put a warm, moist washcloth on your face 3-4 times a day or as told by your doctor. This will help with discomfort.  Wash your hands often with soap and water. If there is no soap and water, use hand sanitizer.  Do not smoke. Avoid being around people who are smoking (secondhand smoke).  Keep all follow-up visits as told by your doctor. This is important. Contact a doctor if:  You have a fever.  Your symptoms get worse.  Your symptoms do not get better within 10 days. Get help right away if:  You have a very bad headache.  You cannot stop throwing up (vomiting).  You have pain or swelling around your face or eyes.  You have trouble seeing.  You feel confused.  Your neck is stiff.  You have trouble breathing. This information is not intended to replace advice given to you by your health care provider. Make sure you discuss any questions you have with your health care provider. Document Released: 07/17/2007 Document Revised: 09/24/2015 Document Reviewed: 11/23/2014 [employer] Interactive Patient Education  2017 [employer]  

## 2016-03-27 NOTE — Progress Notes (Signed)
[name] 71 y.o.   Chief Complaint  Patient presents with  . Sinusitis    HISTORY OF PRESENT ILLNESS: This is a 71 y.o. female complaining of sinus infection for several days.  Sinusitis  This is a new problem. The current episode started in the past 7 days. The problem has been gradually worsening since onset. There has been no fever. Her pain is at a severity of 3/10. The pain is mild. Associated symptoms include congestion, coughing, headaches and sinus pressure. Pertinent negatives include no chills, ear pain, neck pain, shortness of breath, sore throat or swollen glands. Past treatments include nothing.     Prior to Admission medications   Medication Sig Start Date End Date Taking? Authorizing Provider  Calcium-Phosphorus-Vitamin D (CALCIUM GUMMIES PO) Take 2 capsules by mouth daily.   Yes Historical Provider, MD  Cholecalciferol (VITAMIN D PO) Take by mouth daily.   Yes Historical Provider, MD  Coenzyme Q10 (CO Q 10 PO) Take by mouth daily.   Yes Historical Provider, MD  Cyanocobalamin (VITAMIN B 12 PO) Take 1 tablet by mouth daily.   Yes Historical Provider, MD  multivitamin-lutein (OCUVITE-LUTEIN) CAPS capsule Take 1 capsule by mouth daily.   Yes Historical Provider, MD  Omega-3 Fatty Acids (OMEGA 3 PO) Take by mouth daily.   Yes Historical Provider, MD  amoxicillin-clavulanate (AUGMENTIN) 875-125 MG tablet Take 1 tablet by mouth 2 (two) times daily. 03/27/16   Miguel Victorino December, MD  oxyCODONE-acetaminophen (PERCOCET) 5-325 MG per tablet Take 1-2 tablets by mouth every 4 (four) hours as needed for severe pain. Patient not taking: Reported on 03/27/2016 10/10/14   Laurence Spates, MD    No Known Allergies  There are no active problems to display for this patient.   No past medical history on file.  Past Surgical History:  Procedure Laterality Date  . BREAST ENHANCEMENT SURGERY  2007  . NECK MASS EXCISION  2012  . TUBAL LIGATION  1984  . VAGINAL  HYSTERECTOMY  2007    Social History   Social History  . Marital status: Married    Spouse name: N/A  . Number of children: N/A  . Years of education: N/A   Occupational History  . Not on file.   Social History Main Topics  . Smoking status: Never Smoker  . Smokeless tobacco: Never Used  . Alcohol use No  . Drug use: No  . Sexual activity: Not on file   Other Topics Concern  . Not on file   Social History Narrative  . No narrative on file    Family History  Problem Relation Age of Onset  . Colon cancer Neg Hx   . Stroke Father      Review of Systems  Constitutional: Negative for chills and fever.  HENT: Positive for congestion, sinus pain and sinus pressure. Negative for ear pain, nosebleeds and sore throat.   Eyes: Negative for discharge and redness.  Respiratory: Positive for cough and sputum production. Negative for shortness of breath and wheezing.   Cardiovascular: Negative for chest pain, palpitations and leg swelling.  Gastrointestinal: Negative for abdominal pain, diarrhea, nausea and vomiting.  Genitourinary: Negative for dysuria and hematuria.  Musculoskeletal: Negative for myalgias and neck pain.  Skin: Negative for rash.  Neurological: Positive for headaches. Negative for dizziness, sensory change and focal weakness.  All other systems reviewed and are negative.   Vitals:   03/27/16 1056  BP: (!) 148/82  Pulse: 99  Resp: 16  Temp: 98.8 F (37.1 C)    Physical Exam  Constitutional: She is oriented to person, place, and time. She appears well-developed and well-nourished.  HENT:  Head: Normocephalic and atraumatic.  Nose: Mucosal edema present. Right sinus exhibits maxillary sinus tenderness. Left sinus exhibits maxillary sinus tenderness.  Mouth/Throat: Oropharynx is clear and moist. No oropharyngeal exudate.  Eyes: Conjunctivae and EOM are normal. Pupils are equal, round, and reactive to light.  Neck: Normal range of motion. Neck supple. No  JVD present. No thyromegaly present.  Cardiovascular: Normal rate, regular rhythm and normal heart sounds.   Pulmonary/Chest: Effort normal and breath sounds normal. She has no wheezes. She has no rales.  Lymphadenopathy:    She has no cervical adenopathy.  Neurological: She is alert and oriented to person, place, and time. No sensory deficit. She exhibits normal muscle tone.  Skin: Skin is warm and dry. Capillary refill takes less than 2 seconds.  Psychiatric: She has a normal mood and affect. Her behavior is normal.  Vitals reviewed.    ASSESSMENT & PLAN: Ritu was seen today for sinusitis.  Diagnoses and all orders for this visit:  Acute non-recurrent maxillary sinusitis  Sinus congestion  Other orders -     amoxicillin-clavulanate (AUGMENTIN) 875-125 MG tablet; Take 1 tablet by mouth 2 (two) times daily.    Patient Instructions       IF you received an x-ray today, you will receive an invoice from [facility] Rad[facility]. Please contact [facility]iology at [phone] with questions or concerns regarding your invoice.   IF you received labwork today, you will receive an invoice from [place]. Please contact [facility] at [phone] with questions or concerns regarding your invoice.   Our billing staff will not be able to assist you with questions regarding bills from these companies.  You will be contacted with the lab results as soon as they are available. The fastest way to get your results is to activate your My Chart account. Instructions are located on the last page of this paperwork. If you have not heard from [place] regarding the results in 2 weeks, please contact this office.     Sinusitis, Adult Sinusitis is soreness and inflammation of your sinuses. Sinuses are hollow spaces in the bones around your face. They are located:  Around your eyes.  In the middle of your forehead.  Behind your nose.  In your cheekbones. Your sinuses and nasal passages are  lined with a stringy fluid (mucus). Mucus normally drains out of your sinuses. When your nasal tissues get inflamed or swollen, the mucus can get trapped or blocked so air cannot flow through your sinuses. This lets bacteria, viruses, and funguses grow, and that leads to infection. Follow these instructions at home: Medicines  Take, use, or apply over-the-counter and prescription medicines only as told by your doctor. These may include nasal sprays.  If you were prescribed an antibiotic medicine, take it as told by your doctor. Do not stop taking the antibiotic even if you start to feel better. Hydrate and Humidify  Drink enough water to keep your pee (urine) clear or pale yellow.  Use a cool mist humidifier to keep the humidity level in your home above 50%.  Breathe in steam for 10-15 minutes, 3-4 times a day or as told by your doctor. You can do this in the bathroom while a hot shower is running.  Try not to spend time in cool or dry air. Rest  Rest as much as possible.  Sleep with your head raised (elevated).  Make sure to get enough sleep each night. General instructions  Put a warm, moist washcloth on your face 3-4 times a day or as told by your doctor. This will help with discomfort.  Wash your hands often with soap and water. If there is no soap and water, use hand sanitizer.  Do not smoke. Avoid being around people who are smoking (secondhand smoke).  Keep all follow-up visits as told by your doctor. This is important. Contact a doctor if:  You have a fever.  Your symptoms get worse.  Your symptoms do not get better within 10 days. Get help right away if:  You have a very bad headache.  You cannot stop throwing up (vomiting).  You have pain or swelling around your face or eyes.  You have trouble seeing.  You feel confused.  Your neck is stiff.  You have trouble breathing. This information is not intended to replace advice given to you by your health care  provider. Make sure you discuss any questions you have with your health care provider. Document Released: 07/17/2007 Document Revised: 09/24/2015 Document Reviewed: 11/23/2014 Elsevier Interactive Patient Education  2017 Elsevier Inc.      Edwina BarthMiguel Sagardia, MD [facility]

## 2016-07-17 DIAGNOSIS — N819 Female genital prolapse, unspecified: Secondary | ICD-10-CM | POA: Insufficient documentation

## 2016-07-17 DIAGNOSIS — N393 Stress incontinence (female) (male): Secondary | ICD-10-CM | POA: Insufficient documentation

## 2016-11-12 ENCOUNTER — Encounter: Payer: Self-pay | Admitting: Emergency Medicine

## 2016-11-12 ENCOUNTER — Ambulatory Visit (INDEPENDENT_AMBULATORY_CARE_PROVIDER_SITE_OTHER): Payer: BC Managed Care – PPO | Admitting: Emergency Medicine

## 2016-11-12 ENCOUNTER — Ambulatory Visit (INDEPENDENT_AMBULATORY_CARE_PROVIDER_SITE_OTHER): Payer: BC Managed Care – PPO

## 2016-11-12 VITALS — BP 138/84 | HR 91 | Temp 98.9°F | Resp 17 | Ht 64.0 in | Wt 151.2 lb

## 2016-11-12 DIAGNOSIS — M79672 Pain in left foot: Secondary | ICD-10-CM | POA: Diagnosis not present

## 2016-11-12 DIAGNOSIS — S93602A Unspecified sprain of left foot, initial encounter: Secondary | ICD-10-CM

## 2016-11-12 NOTE — Patient Instructions (Addendum)
     IF you received an x-ray today, you will receive an invoice from [facility] Radiology. Please contact [facility] Radiology at [phone] with questions or concerns regarding your invoice.   IF you received labwork today, you will receive an invoice from [place]. Please contact [facility] at [phone] with questions or concerns regarding your invoice.   Our billing staff will not be able to assist you with questions regarding bills from these companies.  You will be contacted with the lab results as soon as they are available. The fastest way to get your results is to activate your My Chart account. Instructions are located on the last page of this paperwork. If you have not heard from [place] regarding the results in 2 weeks, please contact this office.    We recommend that you schedule a mammogram for breast cancer screening. Typically, you do not need a referral to do this. Please contact a local imaging center to schedule your mammogram.  [facility] - [phone]  *ask for the Radiology Department [facility]) - [phone] or [phone]  [facility] - [phone] [facility] - [phone] [facility] - [phone]  *ask for the Radiology Department [facility] - [phone]  *ask for the Radiology Department [facility] - [phone]  *ask for the Mammography Department [facility] - [phone] Foot Pain Many things can cause foot pain. Some common causes are:  An injury.  A sprain.  Arthritis.  Blisters.  Bunions.  Follow these instructions at home: Pay attention to any changes in your symptoms. Take these actions to help with your discomfort:  If directed, put ice on the affected area: ? Put ice in a plastic bag. ? Place a towel between your skin and the bag. ? Leave the ice on for 15-20 minutes, 3?4 times a day for 2 days.  Take  over-the-counter and prescription medicines only as told by your health care provider.  Wear comfortable, supportive shoes that fit you well. Do not wear high heels.  Do not stand or walk for long periods of time.  Do not lift a lot of weight. This can put added pressure on your feet.  Do stretches to relieve foot pain and stiffness as told by your health care provider.  Rub your foot gently.  Keep your feet clean and dry.  Contact a health care provider if:  Your pain does not get better after a few days of self-care.  Your pain gets worse.  You cannot stand on your foot. Get help right away if:  Your foot is numb or tingling.  Your foot or toes are swollen.  Your foot or toes turn white or blue.  You have warmth and redness along your foot. This information is not intended to replace advice given to you by your health care provider. Make sure you discuss any questions you have with your health care provider. Document Released: 02/24/2015 Document Revised: 07/06/2015 Document Reviewed: 02/23/2014 Elsevier Interactive Patient Education  [employer].

## 2016-11-12 NOTE — Progress Notes (Signed)
[name] 71 y.o.   Chief Complaint  Patient presents with  . left foot pain    x 1 1/2 weeks, some swelling, not getting better, pain level 6/10, pain is like a throbbing toothache, taking ibuprofen for pain    HISTORY OF PRESENT ILLNESS: This is a 71 y.o. female complaining of left foot pain since injury 1-2 weeks ago. HPI   Prior to Admission medications   Medication Sig Start Date End Date Taking? Authorizing Provider  multivitamin-lutein (OCUVITE-LUTEIN) CAPS capsule Take 1 capsule by mouth daily.   Yes [provider]  amoxicillin-clavulanate (AUGMENTIN) 875-125 MG tablet Take 1 tablet by mouth 2 (two) times daily. Patient not taking: Reported on 11/12/2016 03/27/16   Georgina Quint, MD  Calcium-Phosphorus-Vitamin D (CALCIUM GUMMIES PO) Take 2 capsules by mouth daily.    [provider]  Cholecalciferol (VITAMIN D PO) Take by mouth daily.    [provider]  Coenzyme Q10 (CO Q 10 PO) Take by mouth daily.    [provider]  Cyanocobalamin (VITAMIN B 12 PO) Take 1 tablet by mouth daily.    [provider]  Omega-3 Fatty Acids (OMEGA 3 PO) Take by mouth daily.    [provider]  oxyCODONE-acetaminophen (PERCOCET) 5-325 MG per tablet Take 1-2 tablets by mouth every 4 (four) hours as needed for severe pain. Patient not taking: Reported on 03/27/2016 10/10/14   Little, Ambrose Finland, MD    No Known Allergies  There are no active problems to display for this patient.   No past medical history on file.  Past Surgical History:  Procedure Laterality Date  . BREAST ENHANCEMENT SURGERY  2007  . NECK MASS EXCISION  2012  . TUBAL LIGATION  1984  . VAGINAL HYSTERECTOMY  2007    Social History   Social History  . Marital status: Married    Spouse name: N/A  . Number of children: N/A  . Years of education: N/A   Occupational History  . Not on file.   Social History Main Topics  . Smoking status: Never Smoker   . Smokeless tobacco: Never Used  . Alcohol use No  . Drug use: No  . Sexual activity: Not on file   Other Topics Concern  . Not on file   Social History Narrative  . No narrative on file    Family History  Problem Relation Age of Onset  . Colon cancer Neg Hx   . Stroke Father      Review of Systems  Constitutional: Negative.  Negative for chills and fever.  Musculoskeletal:       Left foot pain  Skin: Negative.  Negative for rash.  All other systems reviewed and are negative.  Vitals:   11/12/16 1742  BP: 138/84  Pulse: 91  Resp: 17  Temp: 98.9 F (37.2 C)  SpO2: 96%     Physical Exam  Constitutional: She is oriented to person, place, and time. She appears well-developed and well-nourished.  HENT:  Head: Normocephalic.  Eyes: Pupils are equal, round, and reactive to light.  Neck: Normal range of motion.  Cardiovascular: Normal rate.   Pulmonary/Chest: Effort normal.  Musculoskeletal:  Left foot: NVI with FROM; mild swelling and tenderness to dorsal aspect of 3rd, 2nd MTP joints.  Neurological: She is alert and oriented to person, place, and time.  Skin: Capillary refill takes less than 2 seconds.  Psychiatric: She has a normal mood and affect. Her behavior is normal.  Vitals reviewed.  Dg Foot Complete Left  Result Date: 11/12/2016 CLINICAL DATA:  Injury and pain EXAM: LEFT FOOT - COMPLETE 3+ VIEW COMPARISON:  None. FINDINGS: There is no evidence of fracture or dislocation. There is no evidence of arthropathy or other focal bone abnormality. Soft tissues are unremarkable. Small plantar calcaneal spur IMPRESSION: No acute osseous abnormality Electronically Signed   By: Jasmine Pang M.D.   On: 11/12/2016 18:13     ASSESSMENT & PLAN: Malini was seen today for left foot pain.  Diagnoses and all orders for this visit:  Left foot pain -     DG Foot Complete Left; Future  Foot sprain, left, initial encounter    Patient Instructions       IF you  received an x-ray today, you will receive an invoice from [facility] Radiology. Please contact [facility] Radiology at [phone] with questions or concerns regarding your invoice.   IF you received labwork today, you will receive an invoice from [place]. Please contact [facility] at [phone] with questions or concerns regarding your invoice.   Our billing staff will not be able to assist you with questions regarding bills from these companies.  You will be contacted with the lab results as soon as they are available. The fastest way to get your results is to activate your My Chart account. Instructions are located on the last page of this paperwork. If you have not heard from [place] regarding the results in 2 weeks, please contact this office.    We recommend that you schedule a mammogram for breast cancer screening. Typically, you do not need a referral to do this. Please contact a local imaging center to schedule your mammogram.  [facility] - [phone]  *ask for the Radiology Department [facility]) - [phone] or [phone]  [facility] - [phone] [facility] - [phone] [facility] - [phone]  *ask for the Radiology Department [facility] - [phone]  *ask for the Radiology Department [facility] - [phone]  *ask for the Mammography Department [facility] - [phone] Foot Pain Many things can cause foot pain. Some common causes are:  An injury.  A sprain.  Arthritis.  Blisters.  Bunions.  Follow these instructions at home: Pay attention to any changes in your symptoms. Take these actions to help with your discomfort:  If directed, put ice on the affected area: ? Put ice in a plastic bag. ? Place a towel between your skin and the bag. ? Leave the ice on for 15-20 minutes, 3?4 times a day for 2 days.  Take over-the-counter and  prescription medicines only as told by your health care provider.  Wear comfortable, supportive shoes that fit you well. Do not wear high heels.  Do not stand or walk for long periods of time.  Do not lift a lot of weight. This can put added pressure on your feet.  Do stretches to relieve foot pain and stiffness as told by your health care provider.  Rub your foot gently.  Keep your feet clean and dry.  Contact a health care provider if:  Your pain does not get better after a few days of self-care.  Your pain gets worse.  You cannot stand on your foot. Get help right away if:  Your foot is numb or tingling.  Your foot or toes are swollen.  Your foot or toes turn white or blue.  You have warmth and redness along your foot. This information is not intended to replace advice given to you by your health care provider. Make sure you discuss any questions you have with your health care provider. Document Released: 02/24/2015 Document Revised: 07/06/2015 Document Reviewed: 02/23/2014 Elsevier Interactive Patient Education  2018 [employer].      Edwina Barth, MD [facility]

## 2017-03-12 ENCOUNTER — Encounter: Payer: Self-pay | Admitting: Podiatry

## 2017-03-12 ENCOUNTER — Ambulatory Visit (INDEPENDENT_AMBULATORY_CARE_PROVIDER_SITE_OTHER): Payer: BC Managed Care – PPO

## 2017-03-12 ENCOUNTER — Ambulatory Visit: Payer: BC Managed Care – PPO | Admitting: Podiatry

## 2017-03-12 ENCOUNTER — Other Ambulatory Visit: Payer: Self-pay | Admitting: Podiatry

## 2017-03-12 DIAGNOSIS — R600 Localized edema: Secondary | ICD-10-CM | POA: Diagnosis not present

## 2017-03-12 DIAGNOSIS — M25571 Pain in right ankle and joints of right foot: Secondary | ICD-10-CM | POA: Diagnosis not present

## 2017-03-12 DIAGNOSIS — S99919A Unspecified injury of unspecified ankle, initial encounter: Secondary | ICD-10-CM | POA: Diagnosis not present

## 2017-03-12 NOTE — Progress Notes (Signed)
Subjective:   Patient ID: Felicia BlakeLinda C Begay, female   DOB: 72 y.o.   MRN: [id number]   HPI Patient presents stating she had a painful ankle injury right and has swelling still on the inside.  She is wearing a boot and was concerned about the pain she still had and she does not smoke and likes to be active   Review of Systems  All other systems reviewed and are negative.       Objective:  Physical Exam  Constitutional: She appears well-developed and well-nourished.  Cardiovascular: Intact distal pulses.  Pulmonary/Chest: Effort normal.  Musculoskeletal: Normal range of motion.  Neurological: She is alert.  Skin: Skin is warm.  Nursing note and vitals reviewed.   Neurovascular status found to be intact muscle strength adequate range of motion within normal limits with pain to the medial side of the right ankle with inflammation moderate fluid and negative Homans sign noted.  Patient is found to have good digital perfusion and is well oriented x3     Assessment:  Sprain right ankle with continued edema and inflammation with slight instability     Plan:  H&P conditions reviewed and I want her to go and I reapplied a Unna boot to try to reduce the swelling and provide compression immobilization along with Ace wrap.  Continue boot usage and if symptoms are still persistent in the next 4 weeks I want to see her back  X-rays indicate that the joint is congruous with no indications of fracture at this time

## 2017-03-24 DIAGNOSIS — M79673 Pain in unspecified foot: Secondary | ICD-10-CM

## 2017-04-03 ENCOUNTER — Ambulatory Visit: Payer: BC Managed Care – PPO | Admitting: Podiatry

## 2017-04-03 ENCOUNTER — Encounter: Payer: Self-pay | Admitting: Podiatry

## 2017-04-03 DIAGNOSIS — M779 Enthesopathy, unspecified: Secondary | ICD-10-CM

## 2017-04-03 DIAGNOSIS — M25571 Pain in right ankle and joints of right foot: Secondary | ICD-10-CM

## 2017-04-03 MED ORDER — TRIAMCINOLONE ACETONIDE 10 MG/ML IJ SUSP
10.0000 mg | Freq: Once | INTRAMUSCULAR | Status: AC
  Administered 2017-04-03: 10 mg

## 2017-04-06 NOTE — Progress Notes (Signed)
Subjective:   Patient ID: Felicia BlakeLinda C Sugg, female   DOB: 72 y.o.   MRN: [id number]   HPI Patient presents stating that her heel and ankle pain the swelling has gone down a lot but she has developed somewhat of a chronic pain within her ankle   ROS      Objective:  Physical Exam  Neurovascular status intact with the ankle functioning well right with discomfort of the sinus tarsi with inflammation fluid buildup within the sinus tarsi     Assessment:  Inflammatory sinus tarsitis right with healing sprained ankle right     Plan:  H&P condition reviewed and careful injection administered of the sinus tarsi right 3 mg Kenalog 5 mg Xylocaine advised on continued compression and reappoint if symptoms persist

## 2017-10-01 ENCOUNTER — Other Ambulatory Visit: Payer: Self-pay | Admitting: Urology

## 2017-11-03 IMAGING — DX DG FOOT COMPLETE 3+V*L*
3 series · 3 of 3 positions shown · non-contrast
Comparison: None.

CLINICAL DATA: Injury and pain

EXAM:
LEFT FOOT - COMPLETE 3+ VIEW

[foot ap]
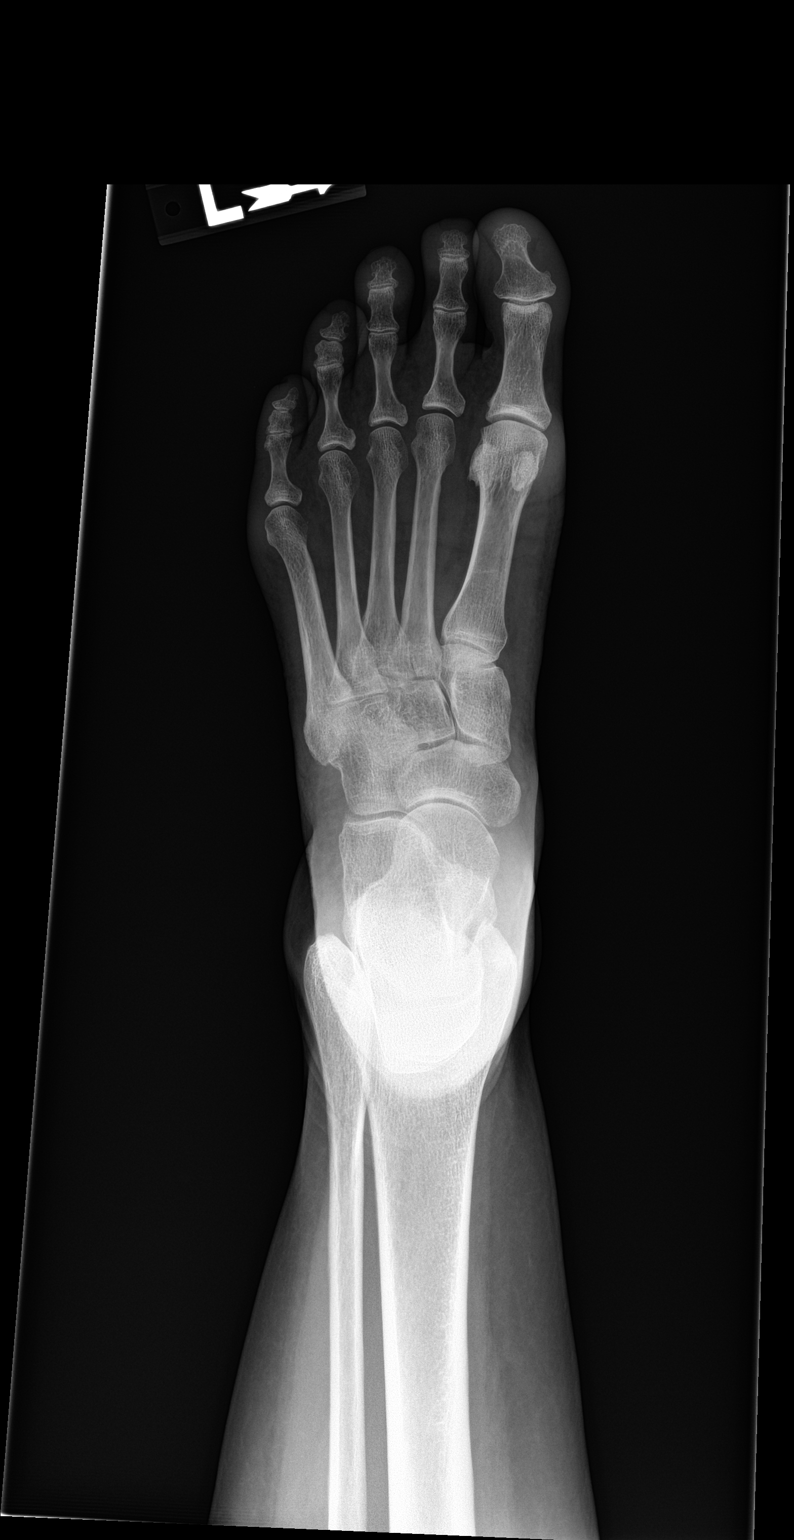

[foot obl]
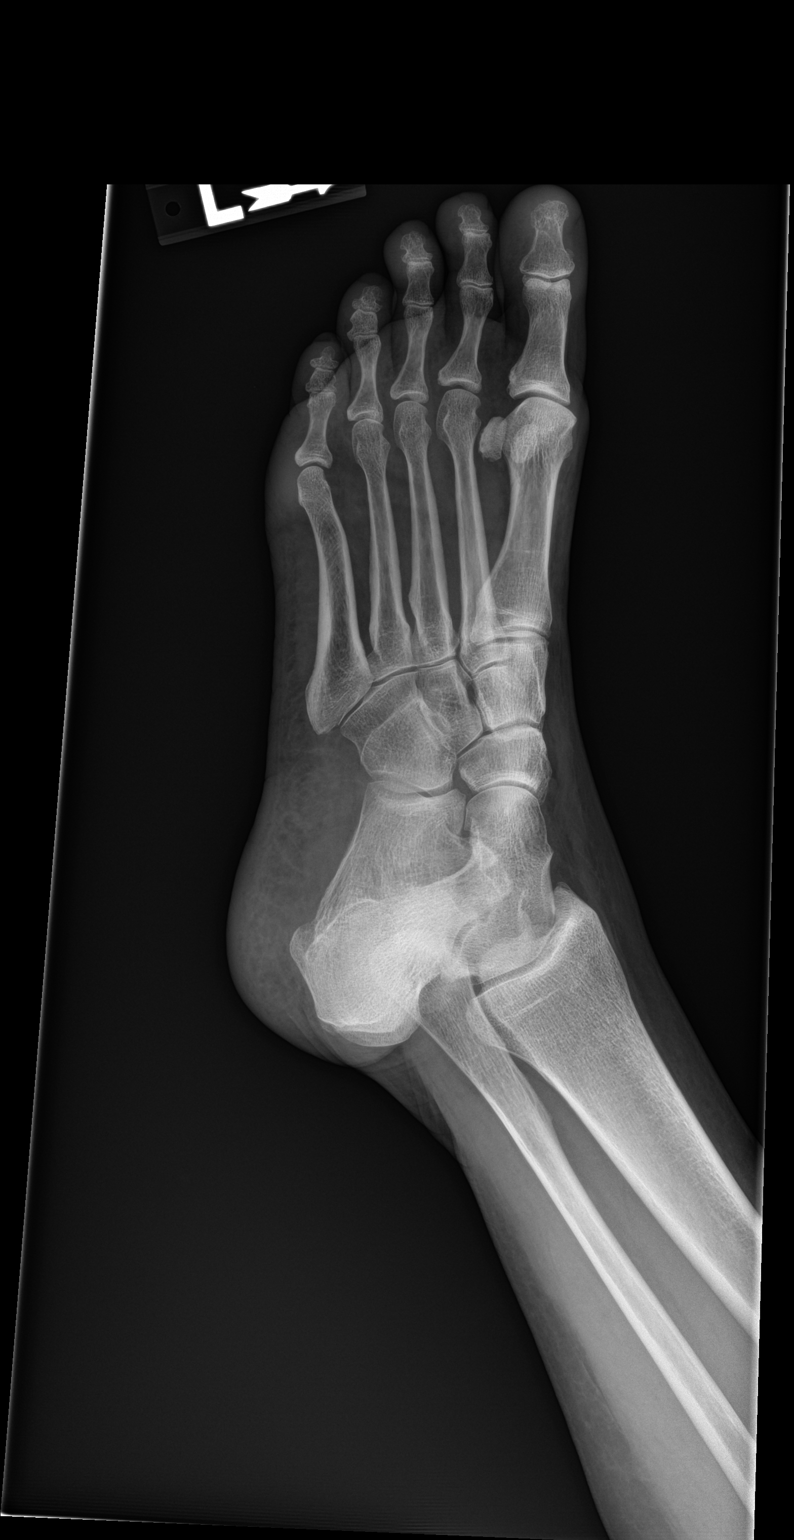

[foot lat]
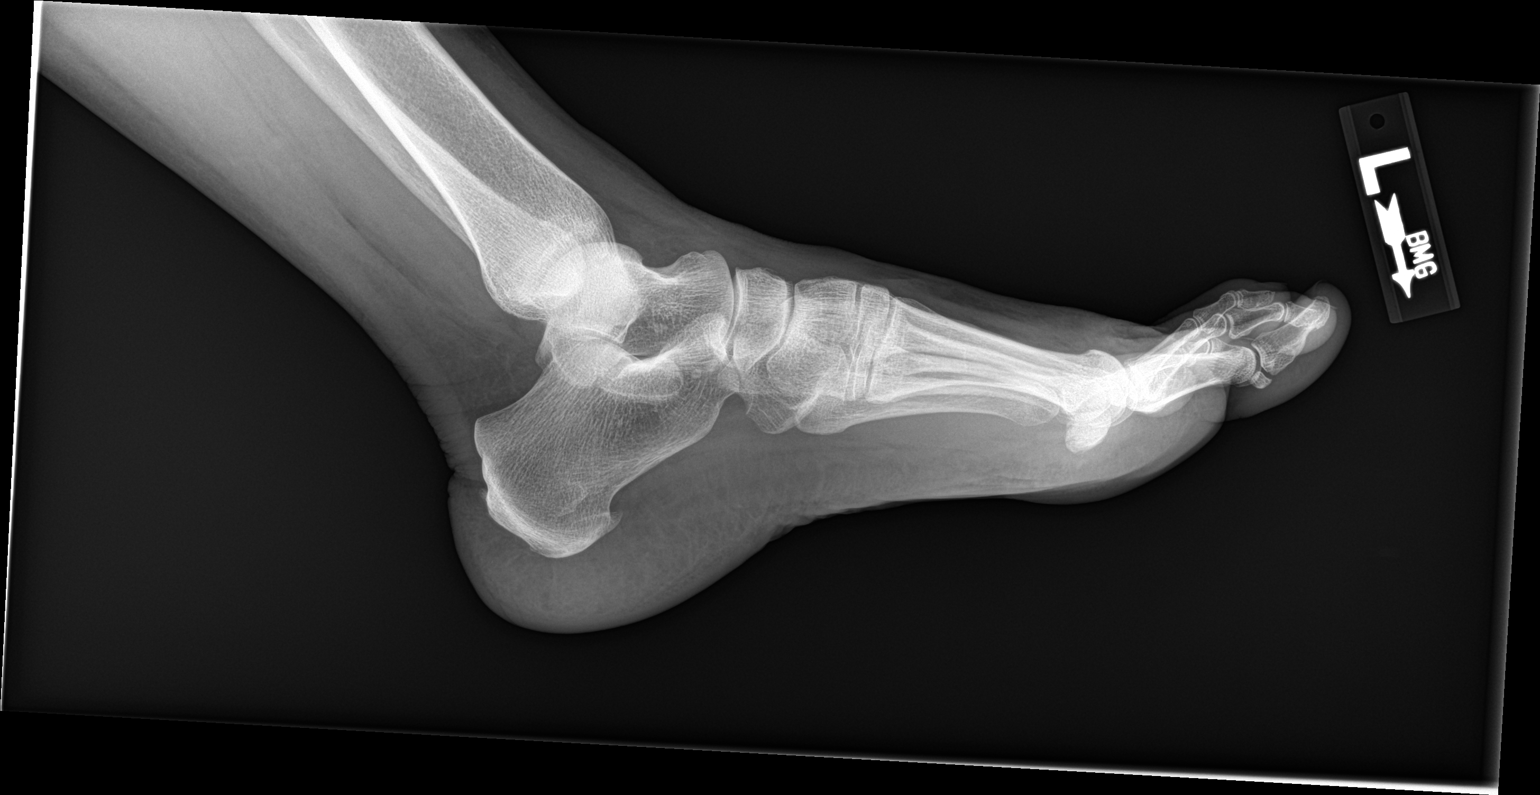

[3 of 3 positions shown; findings below may reference images not displayed]

FINDINGS: There is no evidence of fracture or dislocation. There is no
evidence of arthropathy or other focal bone abnormality. Soft
tissues are unremarkable. Small plantar calcaneal spur
IMPRESSION: No acute osseous abnormality

## 2017-11-10 ENCOUNTER — Encounter (HOSPITAL_COMMUNITY): Payer: Self-pay | Admitting: *Deleted

## 2017-11-26 NOTE — Patient Instructions (Signed)
[name]  11/26/2017   Your procedure is scheduled on: 12-09-17  Report to [facility] Main  Entrance  Report to admitting at 530 AM    Call this number if you have problems the morning of surgery [phone]   Remember: Do not eat food or drink liquids :After Midnight. BRUSH YOUR TEETH MORNING OF SURGERY AND RINSE YOUR MOUTH OUT, NO CHEWING GUM CANDY OR MINTS.     Take these medicines the morning of surgery with A SIP OF WATER: none              You may not have any metal on your body including hair pins and              piercings  Do not wear jewelry, make-up, lotions, powders or perfumes, deodorant             Do not wear nail polish.  Do not shave  48 hours prior to surgery.                 Do not bring valuables to the hospital. [facility] [facility] IS NOT             RESPONSIBLE   FOR VALUABLES.  Contacts, dentures or bridgework may not be worn into surgery.  Leave suitcase in the car. After surgery it may be brought to your room.                 Please read over the following fact sheets you were given: _____________________________________________________________________             [facility] - Preparing for Surgery Before surgery, you can play an important role.  Because skin is not sterile, your skin needs to be as free of germs as possible.  You can reduce the number of germs on your skin by washing with CHG (chlorahexidine gluconate) soap before surgery.  CHG is an antiseptic cleaner which kills germs and bonds with the skin to continue killing germs even after washing. Please DO NOT use if you have an allergy to CHG or antibacterial soaps.  If your skin becomes reddened/irritated stop using the CHG and inform your nurse when you arrive at Short Stay. Do not shave (including legs and underarms) for at least 48 hours prior to the first CHG shower.  You may shave your face/neck. Please follow these instructions carefully:  1.  Shower with CHG  Soap the night before surgery and the  morning of Surgery.  2.  If you choose to wash your hair, wash your hair first as usual with your  normal  shampoo.  3.  After you shampoo, rinse your hair and body thoroughly to remove the  shampoo.                           4.  Use CHG as you would any other liquid soap.  You can apply chg directly  to the skin and wash                       Gently with a scrungie or clean washcloth.  5.  Apply the CHG Soap to your body ONLY FROM THE NECK DOWN.   Do not use on face/ open  Wound or open sores. Avoid contact with eyes, ears mouth and genitals (private parts).                       Wash face,  Genitals (private parts) with your normal soap.             6.  Wash thoroughly, paying special attention to the area where your surgery  will be performed.  7.  Thoroughly rinse your body with warm water from the neck down.  8.  DO NOT shower/wash with your normal soap after using and rinsing off  the CHG Soap.                9.  Pat yourself dry with a clean towel.            10.  Wear clean pajamas.            11.  Place clean sheets on your bed the night of your first shower and do not  sleep with pets. Day of Surgery : Do not apply any lotions/deodorants the morning of surgery.  Please wear clean clothes to the hospital/surgery center.  FAILURE TO FOLLOW THESE INSTRUCTIONS MAY RESULT IN THE CANCELLATION OF YOUR SURGERY PATIENT SIGNATURE_________________________________  NURSE SIGNATURE__________________________________  ________________________________________________________________________  WHAT IS A BLOOD TRANSFUSION? Blood Transfusion Information  A transfusion is the replacement of blood or some of its parts. Blood is made up of multiple cells which provide different functions.  Red blood cells carry oxygen and are used for blood loss replacement.  White blood cells fight against infection.  Platelets control  bleeding.  Plasma helps clot blood.  Other blood products are available for specialized needs, such as hemophilia or other clotting disorders. BEFORE THE TRANSFUSION  Who gives blood for transfusions?   Healthy volunteers who are fully evaluated to make sure their blood is safe. This is blood bank blood. Transfusion therapy is the safest it has ever been in the practice of medicine. Before blood is taken from a donor, a complete history is taken to make sure that person has no history of diseases nor engages in risky social behavior (examples are intravenous drug use or sexual activity with multiple partners). The donor's travel history is screened to minimize risk of transmitting infections, such as malaria. The donated blood is tested for signs of infectious diseases, such as HIV and hepatitis. The blood is then tested to be sure it is compatible with you in order to minimize the chance of a transfusion reaction. If you or a relative donates blood, this is often done in anticipation of surgery and is not appropriate for emergency situations. It takes many days to process the donated blood. RISKS AND COMPLICATIONS Although transfusion therapy is very safe and saves many lives, the main dangers of transfusion include:   Getting an infectious disease.  Developing a transfusion reaction. This is an allergic reaction to something in the blood you were given. Every precaution is taken to prevent this. The decision to have a blood transfusion has been considered carefully by your caregiver before blood is given. Blood is not given unless the benefits outweigh the risks. AFTER THE TRANSFUSION  Right after receiving a blood transfusion, you will usually feel much better and more energetic. This is especially true if your red blood cells have gotten low (anemic). The transfusion raises the level of the red blood cells which carry oxygen, and this usually causes an energy increase.  The  nurse  administering the transfusion will monitor you carefully for complications. HOME CARE INSTRUCTIONS  No special instructions are needed after a transfusion. You may find your energy is better. Speak with your caregiver about any limitations on activity for underlying diseases you may have. SEEK MEDICAL CARE IF:   Your condition is not improving after your transfusion.  You develop redness or irritation at the intravenous (IV) site. SEEK IMMEDIATE MEDICAL CARE IF:  Any of the following symptoms occur over the next 12 hours:  Shaking chills.  You have a temperature by mouth above 102 F (38.9 C), not controlled by medicine.  Chest, back, or muscle pain.  People around you feel you are not acting correctly or are confused.  Shortness of breath or difficulty breathing.  Dizziness and fainting.  You get a rash or develop hives.  You have a decrease in urine output.  Your urine turns a dark color or changes to pink, red, or brown. Any of the following symptoms occur over the next 10 days:  You have a temperature by mouth above 102 F (38.9 C), not controlled by medicine.  Shortness of breath.  Weakness after normal activity.  The white part of the eye turns yellow (jaundice).  You have a decrease in the amount of urine or are urinating less often.  Your urine turns a dark color or changes to pink, red, or brown. Document Released: 01/26/2000 Document Revised: 04/22/2011 Document Reviewed: 09/14/2007 [facility] Patient Information 2014 [place], [place].  _______________________________________________________________________

## 2017-12-02 ENCOUNTER — Other Ambulatory Visit (HOSPITAL_COMMUNITY): Payer: Self-pay

## 2017-12-03 ENCOUNTER — Other Ambulatory Visit: Payer: Self-pay

## 2017-12-03 ENCOUNTER — Encounter (HOSPITAL_COMMUNITY): Payer: Self-pay | Admitting: *Deleted

## 2017-12-03 ENCOUNTER — Encounter (HOSPITAL_COMMUNITY)
Admission: RE | Admit: 2017-12-03 | Discharge: 2017-12-03 | Disposition: A | Discharge status: Home / Self Care | Payer: BC Managed Care – PPO | Source: Ambulatory Visit | Attending: Urology | Admitting: Urology

## 2017-12-03 DIAGNOSIS — Z01812 Encounter for preprocedural laboratory examination: Secondary | ICD-10-CM | POA: Diagnosis not present

## 2017-12-03 HISTORY — DX: Unspecified macular degeneration: H35.30

## 2017-12-03 HISTORY — DX: Personal history of urinary calculi: Z87.442

## 2017-12-03 HISTORY — DX: Uterovaginal prolapse, unspecified: N81.4

## 2017-12-03 LAB — CBC
HEMATOCRIT: 41.3 % (ref 36.0–46.0)
HEMOGLOBIN: 13.4 g/dL (ref 12.0–15.0)
MCH: 31.2 pg (ref 26.0–34.0)
MCHC: 32.4 g/dL (ref 30.0–36.0)
MCV: 96 fL (ref 80.0–100.0)
Platelets: 261 10*3/uL (ref 150–400)
RBC: 4.3 MIL/uL (ref 3.87–5.11)
RDW: 12.1 % (ref 11.5–15.5)
WBC: 5.4 10*3/uL (ref 4.0–10.5)
nRBC: 0 % (ref 0.0–0.2)

## 2017-12-03 LAB — PROTIME-INR
INR: 0.85
PROTHROMBIN TIME: 11.5 s (ref 11.4–15.2)

## 2017-12-03 LAB — BASIC METABOLIC PANEL
ANION GAP: 8 (ref 5–15)
BUN: 17 mg/dL (ref 8–23)
CHLORIDE: 106 mmol/L (ref 98–111)
CO2: 26 mmol/L (ref 22–32)
CREATININE: 1.09 mg/dL — AB (ref 0.44–1.00)
Calcium: 9 mg/dL (ref 8.9–10.3)
GFR calc non Af Amer: 51 mL/min — ABNORMAL LOW (ref >60)
GFR, EST AFRICAN AMERICAN: 59 mL/min — AB (ref >60)
Glucose, Bld: 110 mg/dL — ABNORMAL HIGH (ref 70–99)
POTASSIUM: 4.2 mmol/L (ref 3.5–5.1)
SODIUM: 140 mmol/L (ref 135–145)

## 2017-12-03 LAB — ABO/RH: ABO/RH(D): A POS

## 2017-12-09 ENCOUNTER — Ambulatory Visit (HOSPITAL_COMMUNITY): Payer: BC Managed Care – PPO | Admitting: Certified Registered"

## 2017-12-09 ENCOUNTER — Observation Stay (HOSPITAL_COMMUNITY)
Admission: RE | Admit: 2017-12-09 | Discharge: 2017-12-10 | Disposition: A | Discharge status: Home / Self Care | Payer: BC Managed Care – PPO | Source: Ambulatory Visit | Attending: Urology | Admitting: Urology

## 2017-12-09 ENCOUNTER — Encounter (HOSPITAL_COMMUNITY)
Admission: RE | Disposition: A | Discharge status: Home / Self Care | Payer: Self-pay | Source: Ambulatory Visit | Attending: Urology

## 2017-12-09 ENCOUNTER — Encounter (HOSPITAL_COMMUNITY): Payer: Self-pay

## 2017-12-09 ENCOUNTER — Other Ambulatory Visit: Payer: Self-pay

## 2017-12-09 DIAGNOSIS — N993 Prolapse of vaginal vault after hysterectomy: Secondary | ICD-10-CM | POA: Diagnosis present

## 2017-12-09 DIAGNOSIS — N814 Uterovaginal prolapse, unspecified: Secondary | ICD-10-CM | POA: Diagnosis present

## 2017-12-09 HISTORY — PX: VAGINAL PROLAPSE REPAIR: SHX830

## 2017-12-09 HISTORY — PX: CYSTOSCOPY: SHX5120

## 2017-12-09 HISTORY — PX: CYSTOCELE REPAIR: SHX163

## 2017-12-09 LAB — TYPE AND SCREEN
ABO/RH(D): A POS
ANTIBODY SCREEN: NEGATIVE

## 2017-12-09 LAB — HEMOGLOBIN AND HEMATOCRIT, BLOOD
HEMATOCRIT: 40.2 % (ref 36.0–46.0)
HEMOGLOBIN: 12.9 g/dL (ref 12.0–15.0)

## 2017-12-09 SURGERY — COLPORRHAPHY, ANTERIOR, FOR CYSTOCELE REPAIR
Anesthesia: General

## 2017-12-09 MED ORDER — HYDROCODONE-ACETAMINOPHEN 5-325 MG PO TABS
1.0000 | ORAL_TABLET | ORAL | Status: DC | PRN
  Administered 2017-12-09 – 2017-12-10 (×2): 2 via ORAL
  Filled 2017-12-09 (×2): qty 2

## 2017-12-09 MED ORDER — SODIUM CHLORIDE 0.9 % IV SOLN
INTRAVENOUS | Status: DC | PRN
  Administered 2017-12-09: 500 mL

## 2017-12-09 MED ORDER — HYDROCODONE-ACETAMINOPHEN 5-325 MG PO TABS: 1.0000 | ORAL_TABLET | Freq: Four times a day (QID) | ORAL | 0 refills | Status: DC | PRN

## 2017-12-09 MED ORDER — CLINDAMYCIN PHOSPHATE 2 % VA CREA
TOPICAL_CREAM | VAGINAL | Status: DC | PRN
  Administered 2017-12-09 (×2): 1 via VAGINAL

## 2017-12-09 MED ORDER — MIDAZOLAM HCL 5 MG/5ML IJ SOLN
INTRAMUSCULAR | Status: DC | PRN
  Administered 2017-12-09: 2 mg via INTRAVENOUS

## 2017-12-09 MED ORDER — DIPHENHYDRAMINE HCL 50 MG/ML IJ SOLN: 12.5000 mg | Freq: Four times a day (QID) | INTRAMUSCULAR | Status: DC | PRN

## 2017-12-09 MED ORDER — LACTATED RINGERS IV SOLN
INTRAVENOUS | Status: DC
  Administered 2017-12-09: 06:00:00 via INTRAVENOUS

## 2017-12-09 MED ORDER — FENTANYL CITRATE (PF) 100 MCG/2ML IJ SOLN
INTRAMUSCULAR | Status: DC | PRN
  Administered 2017-12-09: 50 ug via INTRAVENOUS
  Administered 2017-12-09: 100 ug via INTRAVENOUS

## 2017-12-09 MED ORDER — DEXTROSE-NACL 5-0.45 % IV SOLN
INTRAVENOUS | Status: DC
  Administered 2017-12-09 – 2017-12-10 (×2): via INTRAVENOUS

## 2017-12-09 MED ORDER — FENTANYL CITRATE (PF) 100 MCG/2ML IJ SOLN: 25.0000 ug | INTRAMUSCULAR | Status: DC | PRN

## 2017-12-09 MED ORDER — EPHEDRINE SULFATE-NACL 50-0.9 MG/10ML-% IV SOSY
PREFILLED_SYRINGE | INTRAVENOUS | Status: DC | PRN
  Administered 2017-12-09 (×2): 5 mg via INTRAVENOUS
  Administered 2017-12-09: 10 mg via INTRAVENOUS
  Administered 2017-12-09: 5 mg via INTRAVENOUS

## 2017-12-09 MED ORDER — SODIUM CHLORIDE 0.9 % IV SOLN
INTRAVENOUS | Status: AC
  Filled 2017-12-09: qty 500000

## 2017-12-09 MED ORDER — PROPOFOL 10 MG/ML IV BOLUS
INTRAVENOUS | Status: AC
  Filled 2017-12-09: qty 40

## 2017-12-09 MED ORDER — LIDOCAINE 2% (20 MG/ML) 5 ML SYRINGE
INTRAMUSCULAR | Status: DC | PRN
  Administered 2017-12-09: 50 mg via INTRAVENOUS

## 2017-12-09 MED ORDER — ROCURONIUM BROMIDE 10 MG/ML (PF) SYRINGE
PREFILLED_SYRINGE | INTRAVENOUS | Status: DC | PRN
  Administered 2017-12-09: 40 mg via INTRAVENOUS
  Administered 2017-12-09 (×2): 10 mg via INTRAVENOUS

## 2017-12-09 MED ORDER — ONDANSETRON HCL 4 MG/2ML IJ SOLN
INTRAMUSCULAR | Status: DC | PRN
  Administered 2017-12-09: 4 mg via INTRAVENOUS

## 2017-12-09 MED ORDER — ROCURONIUM BROMIDE 10 MG/ML (PF) SYRINGE
PREFILLED_SYRINGE | INTRAVENOUS | Status: AC
  Filled 2017-12-09: qty 10

## 2017-12-09 MED ORDER — LIDOCAINE-EPINEPHRINE (PF) 1 %-1:200000 IJ SOLN
INTRAMUSCULAR | Status: DC | PRN
  Administered 2017-12-09: 20 mL

## 2017-12-09 MED ORDER — CIPROFLOXACIN IN D5W 400 MG/200ML IV SOLN
400.0000 mg | INTRAVENOUS | Status: AC
  Administered 2017-12-09: 400 mg via INTRAVENOUS
  Filled 2017-12-09: qty 200

## 2017-12-09 MED ORDER — FENTANYL CITRATE (PF) 100 MCG/2ML IJ SOLN
INTRAMUSCULAR | Status: AC
  Filled 2017-12-09: qty 2

## 2017-12-09 MED ORDER — DEXAMETHASONE SODIUM PHOSPHATE 10 MG/ML IJ SOLN
INTRAMUSCULAR | Status: DC | PRN
  Administered 2017-12-09: 10 mg via INTRAVENOUS

## 2017-12-09 MED ORDER — LIDOCAINE 2% (20 MG/ML) 5 ML SYRINGE
INTRAMUSCULAR | Status: AC
  Filled 2017-12-09: qty 5

## 2017-12-09 MED ORDER — BELLADONNA ALKALOIDS-OPIUM 16.2-60 MG RE SUPP: 1.0000 | Freq: Four times a day (QID) | RECTAL | Status: DC | PRN

## 2017-12-09 MED ORDER — DIPHENHYDRAMINE HCL 12.5 MG/5ML PO ELIX: 12.5000 mg | ORAL_SOLUTION | Freq: Four times a day (QID) | ORAL | Status: DC | PRN

## 2017-12-09 MED ORDER — ACETAMINOPHEN 325 MG PO TABS: 650.0000 mg | ORAL_TABLET | ORAL | Status: DC | PRN

## 2017-12-09 MED ORDER — CLINDAMYCIN PHOSPHATE 900 MG/50ML IV SOLN
900.0000 mg | INTRAVENOUS | Status: AC
  Administered 2017-12-09: 900 mg via INTRAVENOUS
  Filled 2017-12-09: qty 50

## 2017-12-09 MED ORDER — LIDOCAINE-EPINEPHRINE 1 %-1:100000 IJ SOLN
INTRAMUSCULAR | Status: AC
  Filled 2017-12-09: qty 1

## 2017-12-09 MED ORDER — ACETAMINOPHEN 160 MG/5ML PO SOLN: 1000.0000 mg | Freq: Once | ORAL | Status: DC | PRN

## 2017-12-09 MED ORDER — PROPOFOL 500 MG/50ML IV EMUL: INTRAVENOUS | Status: DC | PRN

## 2017-12-09 MED ORDER — OXYCODONE HCL 5 MG/5ML PO SOLN
5.0000 mg | Freq: Once | ORAL | Status: DC | PRN
  Filled 2017-12-09: qty 5

## 2017-12-09 MED ORDER — PHENAZOPYRIDINE HCL 200 MG PO TABS
200.0000 mg | ORAL_TABLET | ORAL | Status: AC
  Administered 2017-12-09: 200 mg via ORAL
  Filled 2017-12-09: qty 1

## 2017-12-09 MED ORDER — CLINDAMYCIN PHOSPHATE 2 % VA CREA
TOPICAL_CREAM | VAGINAL | Status: AC
  Filled 2017-12-09: qty 80

## 2017-12-09 MED ORDER — ONDANSETRON HCL 4 MG/2ML IJ SOLN: 4.0000 mg | INTRAMUSCULAR | Status: DC | PRN

## 2017-12-09 MED ORDER — PROPOFOL 10 MG/ML IV BOLUS
INTRAVENOUS | Status: DC | PRN
  Administered 2017-12-09: 110 mg via INTRAVENOUS

## 2017-12-09 MED ORDER — ACETAMINOPHEN 500 MG PO TABS: 1000.0000 mg | ORAL_TABLET | Freq: Once | ORAL | Status: DC | PRN

## 2017-12-09 MED ORDER — MIDAZOLAM HCL 2 MG/2ML IJ SOLN
INTRAMUSCULAR | Status: AC
  Filled 2017-12-09: qty 2

## 2017-12-09 MED ORDER — STERILE WATER FOR IRRIGATION IR SOLN
Status: DC | PRN
  Administered 2017-12-09: 3000 mL

## 2017-12-09 MED ORDER — MORPHINE SULFATE (PF) 2 MG/ML IV SOLN
2.0000 mg | INTRAVENOUS | Status: DC | PRN
  Administered 2017-12-09: 2 mg via INTRAVENOUS
  Filled 2017-12-09: qty 1

## 2017-12-09 MED ORDER — PHENYLEPHRINE 40 MCG/ML (10ML) SYRINGE FOR IV PUSH (FOR BLOOD PRESSURE SUPPORT)
PREFILLED_SYRINGE | INTRAVENOUS | Status: DC | PRN
  Administered 2017-12-09 (×2): 80 ug via INTRAVENOUS

## 2017-12-09 MED ORDER — OXYCODONE HCL 5 MG PO TABS: 5.0000 mg | ORAL_TABLET | Freq: Once | ORAL | Status: DC | PRN

## 2017-12-09 MED ORDER — SUGAMMADEX SODIUM 200 MG/2ML IV SOLN
INTRAVENOUS | Status: DC | PRN
  Administered 2017-12-09: 120 mg via INTRAVENOUS

## 2017-12-09 MED ORDER — ACETAMINOPHEN 10 MG/ML IV SOLN: 1000.0000 mg | Freq: Once | INTRAVENOUS | Status: DC | PRN

## 2017-12-09 SURGICAL SUPPLY — 49 items
ALLOGRAFT TUTOPLAST AXIS 6X12 (Tissue) ×1 IMPLANT
BAG URINE DRAINAGE (UROLOGICAL SUPPLIES) IMPLANT
BLADE SURG 15 STRL LF DISP TIS (BLADE) ×1 IMPLANT
BLADE SURG 15 STRL SS (BLADE) ×6
CATH FOLEY 2W COUNCIL 5CC 18FR (CATHETERS) IMPLANT
CATH FOLEY 2WAY SLVR  5CC 14FR (CATHETERS) ×2
CATH FOLEY 2WAY SLVR 5CC 14FR (CATHETERS) ×1 IMPLANT
CLOTH BEACON ORANGE TIMEOUT ST (SAFETY) ×3 IMPLANT
COVER MAYO STAND STRL (DRAPES) ×2 IMPLANT
COVER WAND RF STERILE (DRAPES) ×2 IMPLANT
DEVICE CAPIO SLIM SINGLE (INSTRUMENTS) ×3 IMPLANT
DRAIN PENROSE 18X1/4 LTX STRL (WOUND CARE) ×3 IMPLANT
DRAPE SHEET LG 3/4 BI-LAMINATE (DRAPES) ×3 IMPLANT
ELECT PENCIL ROCKER SW 15FT (MISCELLANEOUS) ×3 IMPLANT
GAUZE 4X4 16PLY RFD (DISPOSABLE) ×8 IMPLANT
GAUZE PACKING 2X5 YD STRL (GAUZE/BANDAGES/DRESSINGS) ×3 IMPLANT
GLOVE BIO SURGEON STRL SZ 6.5 (GLOVE) ×2 IMPLANT
GLOVE BIO SURGEONS STRL SZ 6.5 (GLOVE) ×1
GLOVE BIOGEL M STRL SZ7.5 (GLOVE) ×3 IMPLANT
GLOVE ECLIPSE 8.5 STRL (GLOVE) ×3 IMPLANT
GOWN STRL REUS W/TWL XL LVL3 (GOWN DISPOSABLE) ×3 IMPLANT
HOLDER FOLEY CATH W/STRAP (MISCELLANEOUS) ×3 IMPLANT
KIT BASIN OR (CUSTOM PROCEDURE TRAY) ×3 IMPLANT
NDL MAYO 6 CRC TAPER PT (NEEDLE) ×1 IMPLANT
NEEDLE HYPO 22GX1.5 SAFETY (NEEDLE) ×2 IMPLANT
NEEDLE MAYO 6 CRC TAPER PT (NEEDLE) ×3 IMPLANT
NS IRRIG 1000ML POUR BTL (IV SOLUTION) ×3 IMPLANT
PACK CYSTO (CUSTOM PROCEDURE TRAY) ×3 IMPLANT
PLUG CATH AND CAP STER (CATHETERS) ×3 IMPLANT
RETRACTOR STAY HOOK 5MM (MISCELLANEOUS) ×3 IMPLANT
SHEET LAVH (DRAPES) ×3 IMPLANT
SUT CAPIO ETHIBPND (SUTURE) ×4 IMPLANT
SUT VIC AB 0 CT1 27 (SUTURE)
SUT VIC AB 0 CT1 27XBRD ANTBC (SUTURE) ×1 IMPLANT
SUT VIC AB 2-0 CT1 27 (SUTURE) ×6
SUT VIC AB 2-0 CT1 27XBRD (SUTURE) ×2 IMPLANT
SUT VIC AB 2-0 SH 27 (SUTURE) ×6
SUT VIC AB 2-0 SH 27X BRD (SUTURE) ×2 IMPLANT
SUT VIC AB 3-0 SH 27 (SUTURE) ×6
SUT VIC AB 3-0 SH 27XBRD (SUTURE) ×2 IMPLANT
SUT VICRYL 0 UR6 27IN ABS (SUTURE) ×6 IMPLANT
SYR 10ML LL (SYRINGE) ×3 IMPLANT
TOWEL OR 17X26 10 PK STRL BLUE (TOWEL DISPOSABLE) ×3 IMPLANT
TOWEL OR NON WOVEN STRL DISP B (DISPOSABLE) ×3 IMPLANT
TUBING CONNECTING 10 (TUBING) ×2 IMPLANT
TUBING CONNECTING 10' (TUBING) ×1
TUTOPLAST AXIS 6X12 (Tissue) ×3 IMPLANT
WATER STERILE IRR 1000ML POUR (IV SOLUTION) ×3 IMPLANT
YANKAUER SUCT BULB TIP 10FT TU (MISCELLANEOUS) ×3 IMPLANT

## 2017-12-09 NOTE — Interval H&P Note (Signed)
History and Physical Interval Note:  12/09/2017 7:40 AM  [name]  has presented today for surgery, with the diagnosis of CYSTOCELE VAULT PROLAPSE  The various methods of treatment have been discussed with the patient and family. After consideration of risks, benefits and other options for treatment, the patient has consented to  Procedure(s): ANTERIOR REPAIR (CYSTOCELE) (N/A) VAGINAL VAULT PROLAPSE REPAIR WITH GRAFT (N/A) CYSTOSCOPY (N/A) as a surgical intervention .  The patient's history has been reviewed, patient examined, no change in status, stable for surgery.  I have reviewed the patient's chart and labs.  Questions were answered to the patient's satisfaction.     Leyana Whidden A

## 2017-12-09 NOTE — Transfer of Care (Signed)
Immediate Anesthesia Transfer of Care Note  Patient: [name]  Procedure(s) Performed: ANTERIOR REPAIR (CYSTOCELE) (N/A ) VAGINAL VAULT PROLAPSE REPAIR WITH GRAFT (N/A ) CYSTOSCOPY (N/A )  Patient Location: PACU  Anesthesia Type:General  Level of Consciousness: awake, alert  and patient cooperative  Airway & Oxygen Therapy: Patient Spontanous Breathing and Patient connected to face mask oxygen  Post-op Assessment: Report given to RN, Post -op Vital signs reviewed and stable and Patient moving all extremities X 4  Post vital signs: stable  Last Vitals:  Vitals Value Taken Time  BP    Temp    Pulse 91 12/09/2017 10:35 AM  Resp 21 12/09/2017 10:35 AM  SpO2 100 % 12/09/2017 10:35 AM  Vitals shown include unvalidated device data.  Last Pain:  Vitals:   12/09/17 0610  TempSrc:   PainSc: 0-No pain         Complications: No apparent anesthesia complications

## 2017-12-09 NOTE — H&P (Signed)
Chief complaint: Pelvic organ prolapse; midline cystocele  History of present illness: patient is symptomatic prolapse.  She feels vaginal bulging.  She has mild mixed incontinence.  Frequency times every 1-2 hours.  Has had a previous hysterectomy  Past history: Hysterectomy  Allergies: None  Medications: As listed   Physical examination Cardiovascular: Pulse regular skin warm Respiratory: Breast quiet Abdomen: No masses Skin,: No rashes Neurologic: Alert Urologic: Cystocele  The patient I reviewed a transvaginal vault suspension with cystocele repair and graft.  I will add another history and physical today medical record following the case.  For unknown reason history was not loaded yesterday

## 2017-12-09 NOTE — Anesthesia Preprocedure Evaluation (Signed)
Anesthesia Evaluation  Patient identified by MRN, date of birth, ID band Patient awake    Reviewed: Allergy & Precautions, NPO status , Patient's Chart, lab work & pertinent test results  History of Anesthesia Complications Negative for: history of anesthetic complications  Airway Mallampati: I  TM Distance: >3 FB Neck ROM: Full    Dental  (+) Teeth Intact   Pulmonary neg pulmonary ROS,    breath sounds clear to auscultation       Cardiovascular negative cardio ROS   Rhythm:Regular     Neuro/Psych negative neurological ROS  negative psych ROS   GI/Hepatic negative GI ROS, Neg liver ROS,   Endo/Other  negative endocrine ROS  Renal/GU negative Renal ROS     Musculoskeletal negative musculoskeletal ROS (+)   Abdominal   Peds  Hematology negative hematology ROS (+)   Anesthesia Other Findings   Reproductive/Obstetrics                             Anesthesia Physical Anesthesia Plan  ASA: I  Anesthesia Plan: General   Post-op Pain Management:    Induction: Intravenous  PONV Risk Score and Plan: 3 and Ondansetron and Dexamethasone  Airway Management Planned: Oral ETT and LMA  Additional Equipment: None  Intra-op Plan:   Post-operative Plan: Extubation in OR  Informed Consent: I have reviewed the patients History and Physical, chart, labs and discussed the procedure including the risks, benefits and alternatives for the proposed anesthesia with the patient or authorized representative who has indicated his/her understanding and acceptance.   Dental advisory given  Plan Discussed with: CRNA and Surgeon  Anesthesia Plan Comments:         Anesthesia Quick Evaluation

## 2017-12-09 NOTE — H&P (Signed)
I was consulted by the above provider to assess the patient's prolapse perhaps worsening over 2 years. She has had a pessary. She did not wear today. She does not wear a pad. Rarely she can leak with cough or sneeze or with urgency but stays dry especially she time voids.   She voids every 2 or 3 hours gets up twice a night   She had a bladder suspension and hysterectomy in the past. She feels vaginal bulging. Sometimes she reduces it.   On pelvic examination the patient had a grade 3 cystocele that exited the introitus. The vaginal cuff descended from either 9 cm to about 5 cm. I did not identify the vaginal cuff dimples for certain period with the cystocele reduced she had mild hypermobility the bladder neck. She had a mild positive cough test. With the prolapse reduced she did not have posterior prolapse   The patient has prolapse symptoms with very mild mixed incontinence. She has mild frequency nocturia. Role of urodynamics discussed. picture drawn. If the patient ever had surgery she would likely best benefit from a transvaginal vault suspension and cystocele repair and graft.   Today  Prolapse and frequency are stable  On urodynamics the patient did not void and was catheterized for fifty mL. Patient's bladder capacity was six hundred seventy five mL. Bladder was stable. She did not leak with the prolapse reduced at a pressure 110 cm of water. During voluntary voiding she had difficulty voiding in the lab setting. She was having a bladder contraction and could not void. She then voided with a interrupted flow pattern. She voided two hundred mL with maximum flow mL per second. Maximum voiding pressure of sixteen cm of water. Residual was four hundred seventy seven mL. The contraction generator was weak and not well sustained. Bladder neck descended 3 cm. She had a large cystocele noted. Her bladder was a bit hypo sensitive. After double voiding her residual was still hundred twenty five mL. EMG  activity was increased during the voiding phase. The details of the urodynamics are signed and dictated.   today we talked about a pessary versus watchful waiting versus prolapse surgery. I talked to her briefly about a sling. She does not leak with the pessary in place. One could argue she has risk factors for inefficient bladder emptying. the risk of worsening incontinence including severity discussed. Need for delayed surgery discussed.   The patient I agree not to perform a sling. She wants to think about will call me if she wishes to proceed with surgery. 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 for the potential chance of having a live person's      ALLERGIES: No Allergies    MEDICATIONS: Lutein     GU PSH: Complex cystometrogram, w/ void pressure and urethral pressure profile studies, any technique - 08/07/2017 Complex Uroflow - 08/07/2017 Emg surf Electrd - 08/07/2017 Inject For cystogram - 08/07/2017 Intrabd voidng Press - 08/07/2017 Vaginal Hysterectomy - 2016      PSH Notes: Breast Surgery Enlargement Procedure, Neck Surgery, Vaginal Hysterectomy, Tubal Ligation   NON-GU PSH: Enlarge Breast - 2016 Tubal Ligation - 2016    GU PMH: Mixed incontinence - 07/10/2017 Nocturia - 07/10/2017 Urinary Frequency - 07/10/2017 Other microscopic hematuria, Microscopic hematuria - 2016 Ureteral calculus, Ureteral stone - 2016      PMH Notes:  2014-10-14 09:46:20 - Note: No significant past medical history   NON-GU PMH:  Encounter for general adult medical examination without abnormal findings, Encounter for preventive health examination - 2014/06/27    FAMILY HISTORY: Death of family member - Runs In Family Hypertension - Runs In Family Pacemaker Placement - Runs In Family renal failure - Runs In Family Strokes - Runs In Family   SOCIAL HISTORY: None    Notes: Married, Caffeine use, Occupation, Number of children, Alcohol use, Never a smoker    REVIEW OF SYSTEMS:    GU Review Female:   Patient denies frequent urination, hard to postpone urination, burning /pain with urination, get up at night to urinate, leakage of urine, stream starts and stops, trouble starting your stream, have to strain to urinate, and being pregnant.  Gastrointestinal (Upper):   Patient denies nausea, vomiting, and indigestion/ heartburn.  Gastrointestinal (Lower):   Patient denies diarrhea and constipation.  Constitutional:   Patient denies fever, night sweats, weight loss, and fatigue.  Skin:   Patient denies skin rash/ lesion and itching.  Eyes:   Patient denies blurred vision and double vision.  Ears/ Nose/ Throat:   Patient denies sore throat and sinus problems.  Hematologic/Lymphatic:   Patient denies swollen glands and easy bruising.  Cardiovascular:   Patient denies chest pains and leg swelling.  Respiratory:   Patient denies cough and shortness of breath.  Endocrine:   Patient denies excessive thirst.  Musculoskeletal:   Patient denies back pain and joint pain.  Neurological:   Patient denies headaches and dizziness.  Psychologic:   Patient denies depression and anxiety.   VITAL SIGNS: None   PAST DATA REVIEWED:  Source Of History:  Patient   PROCEDURES: None   ASSESSMENT:      ICD-10 Details  1 GU:   Mixed incontinence - N39.46   2   Urinary Frequency - R35.0   3   Cystocele, midline - N81.11               Notes:   I drew her a picture and we talked about prolapse surgery in detail. Pros, cons, general surgical and anesthetic risks, and other options including behavioral therapy, pessaries, and watchful waiting were discussed. She understands that prolapse repairs are successful in 80-85% of cases for prolapse symptoms and can recur anteriorly, posteriorly, and/or apically. She understands that in most cases I use a graft and general risks were discussed. Surgical risks were described but not limited to the discussion of injury to  neighboring structures including the bowel (with possible life-threatening sepsis and colostomy), bladder, urethra, vagina (all resulting in further surgery), and ureter (resulting in re-implantation). We talked about injury to nerves/soft tissue leading to debilitating and intractable pelvic, abdominal, and lower extremity pain syndromes and neuropathies. The risks of buttock pain, intractable dyspareunia, and vaginal narrowing and shortening with sequelae were discussed. Bleeding risks, transfusion rates, and infection were discussed. The risk of persistent, de novo, or worsening bladder and/or bowel incontinence/dysfunction was discussed. The need for CIC was described as well the usual post-operative course. The patient understands that she might not reach her treatment goal and that she might be worse following surgery.    After a thorough review of the management options for the patient's condition the patient  elected to proceed with surgical therapy as noted above. We have discussed the potential benefits and risks of the procedure, side effects of the proposed treatment, the likelihood of the patient achieving the goals of the procedure, and any potential problems that might occur during the procedure or recuperation.  Informed consent has been obtained.

## 2017-12-09 NOTE — Anesthesia Procedure Notes (Signed)
Procedure Name: Intubation Date/Time: 12/09/2017 7:53 AM Performed by: Donna Bernard, CRNA Pre-anesthesia Checklist: Patient identified, Emergency Drugs available, Suction available, Patient being monitored and Timeout performed Patient Re-evaluated:Patient Re-evaluated prior to induction Oxygen Delivery Method: Circle system utilized Preoxygenation: Pre-oxygenation with 100% oxygen Induction Type: IV induction Ventilation: Mask ventilation without difficulty Laryngoscope Size: Miller and 3 Grade View: Grade I Tube type: Oral Tube size: 7.0 mm Number of attempts: 1 Airway Equipment and Method: Stylet Placement Confirmation: positive ETCO2,  ETT inserted through vocal cords under direct vision,  CO2 detector and breath sounds checked- equal and bilateral Secured at: 22 cm Tube secured with: Tape Dental Injury: Teeth and Oropharynx as per pre-operative assessment

## 2017-12-09 NOTE — Op Note (Signed)
Preoperative diagnosis: Midline cystocele and vault prolapse Postoperative diagnosis: Midline cystocele and vault prolapse Surgery: Cystocele repair and graft and vault suspension and cystoscopy Surgeon: Dr. Lorin Picket Keionna Kinnaird Assistant: Harrie Foreman  The assistant was present and necessary for all steps of the operation described. The assistant played a critical role assisting during the operation  The patient has the above diagnosis and consented to the above procedure.  Preoperative antibiotics were given.  Extra care was taken with leg positioning to minimize the risk of compartment syndrome and neuropathy and deep vein thrombosis  Patient had a grade 3 cystocele with central defect that reached the introitus.  I took several minutes identifying the cuff.  She had weakness posteriorly at the cuff as well.  I placed a 3-0 Vicryl and it was in good position.  She did have a narrow pelvis  I instilled 20 cc of the lidocaine epinephrine mixture.  I made a T-shaped anterior vaginal wall incision with my usual technique utilizing Allis clamps.  I mobilized the vaginal epithelium from the underlying pubocervical fascia to the white line bilaterally.  I was pleased with my mobilization at the apex  I did an anterior repair with running 2-0 Vicryl suture double layer.  I was diligent to do an anatomic repair and not lose any bladder length.  The bladder neck was not imbricated.  I was very pleased with the cystocele repair.  I cystoscoped the patient.  Cystoscopically she had a good repair.  Ureters were not deviated medially with excellent efflux bilaterally.  No suture in the bladder.  Along the appropriate plane I mobilized down to the ischial spine bilaterally.  I swept soft tissue medially.  The sacrospinous ligament was identified.  0 Vicryl on a Capio device was placed at least 1 fingerbreadth medial to the spine in a straight line between the spine.  This was triple checked.  I did a rectal  examination and there is no suture in the bladder and excellent position of suture.  I did my usual mobilization at the urethrovesical angle and placed a 0 Vicryl into the pelvic sidewall bilaterally.    I cut a 10 x 6 fascia lata graft in the shape of the trapezoid.  It was sewn in tension-free between the 4 sutures.  I trimmed an appropriate amount of anterior vaginal wall and closed anterior vaginal wall with running 2-0 Vicryl and CT1 needle.  It indicates she had excellent length.  She had excellent reduction of the cystocele.  She had mild posterior apical weakness but no rectocele in the lower two thirds of the vagina.  Vaginal pack with clindamycin applied x2.  Blood loss less than 50 mL.  Leg position good.  Urine output good.  Hope the patient achieves her treatment goal

## 2017-12-09 NOTE — Plan of Care (Signed)
  Problem: Clinical Measurements: Goal: Postoperative complications will be avoided or minimized Outcome: Progressing   Problem: Health Behavior/Discharge Planning: Goal: Ability to manage health-related needs will improve Outcome: Progressing   Problem: Clinical Measurements: Goal: Ability to maintain clinical measurements within normal limits will improve Outcome: Progressing Goal: Will remain free from infection Outcome: Progressing Goal: Diagnostic test results will improve Outcome: Progressing Goal: Respiratory complications will improve Outcome: Progressing Goal: Cardiovascular complication will be avoided Outcome: Progressing   Problem: Activity: Goal: Risk for activity intolerance will decrease Outcome: Progressing   Problem: Pain Managment: Goal: General experience of comfort will improve Outcome: Progressing   Problem: Safety: Goal: Ability to remain free from injury will improve Outcome: Progressing   Problem: Skin Integrity: Goal: Risk for impaired skin integrity will decrease Outcome: Progressing

## 2017-12-10 ENCOUNTER — Encounter (HOSPITAL_COMMUNITY): Payer: Self-pay | Admitting: Urology

## 2017-12-10 DIAGNOSIS — N993 Prolapse of vaginal vault after hysterectomy: Secondary | ICD-10-CM | POA: Diagnosis not present

## 2017-12-10 LAB — BASIC METABOLIC PANEL
Anion gap: 7 (ref 5–15)
BUN: 10 mg/dL (ref 8–23)
CHLORIDE: 112 mmol/L — AB (ref 98–111)
CO2: 24 mmol/L (ref 22–32)
CREATININE: 0.59 mg/dL (ref 0.44–1.00)
Calcium: 8.8 mg/dL — ABNORMAL LOW (ref 8.9–10.3)
GFR calc non Af Amer: >60 mL/min (ref >60)
Glucose, Bld: 141 mg/dL — ABNORMAL HIGH (ref 70–99)
Potassium: 4 mmol/L (ref 3.5–5.1)
Sodium: 143 mmol/L (ref 135–145)

## 2017-12-10 LAB — HEMOGLOBIN AND HEMATOCRIT, BLOOD
HEMATOCRIT: 38 % (ref 36.0–46.0)
Hemoglobin: 12.2 g/dL (ref 12.0–15.0)

## 2017-12-10 NOTE — Discharge Summary (Signed)
[facility] Discharge Summary  Admit date: 12/09/2017  Discharge date and time: 12/10/17   Discharge to: Home  Discharge Service: Urology  Discharge Attending Physician:  Alfredo Martinez, MD  Discharge  Diagnoses: Midline cystocele and vault prolapse  Secondary Diagnosis: Active Problems:   Cystocele with prolapse   OR Procedures: Procedure(s): ANTERIOR REPAIR (CYSTOCELE) WITH GRAFT VAGINAL VAULT PROLAPSE REPAIR CYSTOSCOPY 12/09/2017   Ancillary Procedures: None   Discharge Day Services: The patient was seen and examined by the Urology team both in the morning and immediately prior to discharge.  Vital signs and laboratory values were stable and within normal limits.  The physical exam was benign and unchanged and all surgical wounds were examined.  Discharge instructions were explained and all questions answered.  Subjective No acute events overnight. Pain Controlled. No fever or chills.  Objective Patient Vitals for the past 8 hrs:  BP Temp Temp src Pulse Resp SpO2  12/10/17 0504 121/75 98.8 F (37.1 C) Oral 73 17 98 %  12/10/17 0026 120/60 97.7 F (36.5 C) Oral 81 16 96 %   Total I/O In: 131.3 [I.V.:131.3] Out: -   General Appearance:        No acute distress Lungs:                       Normal work of breathing on room air Heart:                                Regular rate and rhythm Abdomen:                         Soft, non-tender, non-distended Extremities:                      Warm and well perfused GU:    Vaginal packing without evidence of vaginal bleeding   Hospital Course:  The patient underwent cystocele repair and graft and vault suspension and cystoscopy on 12/09/2017.  The patient tolerated the procedure well, was extubated in the OR, and afterwards was taken to the PACU for routine post-surgical care. When stable the patient was transferred to the floor.   The patient did well postoperatively.  The patient's diet was slowly advanced and at  the time of discharge was tolerating a regular diet.  The patient was discharged home 1 Day Post-Op, at which point was tolerating a regular solid diet, was able to void spontaneously, have adequate pain control with P.O. pain medication, and could ambulate without difficulty. The patient will follow up with [name] for post op check. She passed a trial of void prior to discharge and vaginal packing was removed.   Condition at Discharge: Improved  Discharge Medications:  Allergies as of 12/10/2017   No Known Allergies     Medication List    STOP taking these medications   multivitamin-lutein Caps capsule     TAKE these medications   HYDROcodone-acetaminophen 5-325 MG tablet Commonly known as:  NORCO/VICODIN Take 1-2 tablets by mouth every 6 (six) hours as needed for moderate pain.

## 2017-12-10 NOTE — Discharge Instructions (Signed)
I have reviewed discharge instructions in detail with the patient. They will follow-up with me or their physician as scheduled. My nurse will also be calling the patients as per protocol. As discussed with Dr. Nahia Nissan. ° °You may resume aspirin, advil, aleve, vitamins, and supplements 7 days after surgery. °

## 2017-12-23 NOTE — Anesthesia Postprocedure Evaluation (Signed)
Anesthesia Post Note  Patient: Felicia BlakeLinda C Rosales  Procedure(s) Performed: ANTERIOR REPAIR (CYSTOCELE) (N/A ) VAGINAL VAULT PROLAPSE REPAIR WITH GRAFT (N/A ) CYSTOSCOPY (N/A )     Patient location during evaluation: PACU Anesthesia Type: General Level of consciousness: awake and alert Pain management: pain level controlled Vital Signs Assessment: post-procedure vital signs reviewed and stable Respiratory status: spontaneous breathing, nonlabored ventilation, respiratory function stable and patient connected to nasal cannula oxygen Cardiovascular status: blood pressure returned to baseline and stable Postop Assessment: no apparent nausea or vomiting Anesthetic complications: no    Last Vitals:  Vitals:   12/10/17 0026 12/10/17 0504  BP: 120/60 121/75  Pulse: 81 73  Resp: 16 17  Temp: 36.5 C 37.1 C  SpO2: 96% 98%    Last Pain:  Vitals:   12/10/17 0745  TempSrc:   PainSc: 0-No pain                 Edie Vallandingham

## 2018-09-03 ENCOUNTER — Encounter: Payer: Self-pay | Admitting: Gastroenterology

## 2018-09-24 ENCOUNTER — Encounter: Payer: Self-pay | Admitting: Emergency Medicine

## 2018-09-24 ENCOUNTER — Telehealth (INDEPENDENT_AMBULATORY_CARE_PROVIDER_SITE_OTHER): Payer: BC Managed Care – PPO | Admitting: Emergency Medicine

## 2018-09-24 VITALS — Ht 64.5 in | Wt 150.0 lb

## 2018-09-24 DIAGNOSIS — F418 Other specified anxiety disorders: Secondary | ICD-10-CM

## 2018-09-24 DIAGNOSIS — F4010 Social phobia, unspecified: Secondary | ICD-10-CM | POA: Diagnosis not present

## 2018-09-24 DIAGNOSIS — R030 Elevated blood-pressure reading, without diagnosis of hypertension: Secondary | ICD-10-CM

## 2018-09-24 MED ORDER — SERTRALINE HCL 25 MG PO TABS: 25.0000 mg | ORAL_TABLET | Freq: Every day | ORAL | 3 refills | Status: DC

## 2018-09-24 NOTE — Progress Notes (Signed)
Telemedicine Encounter- SOAP NOTE Established Patient  This telephone encounter was conducted with the patient's (or proxy's) verbal consent via audio telecommunications: yes/no: Yes Patient was instructed to have this encounter in a suitably private space; and to only have persons present to whom they give permission to participate. In addition, patient identity was confirmed by use of name plus two identifiers (DOB and address).  I discussed the limitations, risks, security and privacy concerns of performing an evaluation and management service by telephone and the availability of in person appointments. I also discussed with the patient that there may be a patient responsible charge related to this service. The patient expressed understanding and agreed to proceed.  I spent a total of TIME; 0 MIN TO 60 MIN: 15 minutes talking with the patient or their proxy.  No chief complaint on file. Concerns about anxiety on high blood pressure  Subjective   [name] is a 73 y.o. female established patient. Telephone visit today concerned about anxiety racing her blood pressure.  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  This along with the pandemic and income uncertainties of created a tremendous amount of stress which is affecting her systemic blood pressure.  Readings at home have been 170s over 90s.  Patient does not want to start blood pressure medication but feels that treating her anxiety well controlled her blood pressure.  Daughter inquiring about taking Zoloft as recommended by her daughter who is actually taking it with good results.  She is willing to take Zoloft for a couple of weeks, monitor her blood pressure, and take it from there depending on results.  Has no other concerns or medical complaints today.  HPI   Patient Active Problem List   Diagnosis Date Noted  . Cystocele with prolapse 12/09/2017    Past  Medical History:  Diagnosis Date  . Cystocele with prolapse    VAULT PROLAPSE  . History of kidney stones   . Macular degeneration BOTH EYES    Current Outpatient Medications  Medication Sig Dispense Refill  . multivitamin-lutein (OCUVITE-LUTEIN) CAPS capsule Take 1 capsule by mouth daily.    Marland Kitchen HYDROcodone-acetaminophen (NORCO) 5-325 MG tablet Take 1-2 tablets by mouth every 6 (six) hours as needed for moderate pain. (Patient not taking: Reported on 09/24/2018) 30 tablet 0  . sertraline (ZOLOFT) 25 MG tablet Take 1 tablet (25 mg total) by mouth daily. 30 tablet 3   No current facility-administered medications for this visit.     No Known Allergies  Social History   Socioeconomic History  . Marital status: Married    Spouse name: Not on file  . Number of children: Not on file  . Years of education: Not on file  . Highest education level: Not on file  Occupational History  . Not on file  Social Needs  . Financial resource strain: Not on file  . Food insecurity    Worry: Not on file    Inability: Not on file  . Transportation needs    Medical: Not on file    Non-medical: Not on file  Tobacco Use  . Smoking status: Never Smoker  . Smokeless tobacco: Never Used  Substance and Sexual Activity  . Alcohol use: No  . Drug use: No  . Sexual activity: Not on file  Lifestyle  . Physical activity    Days per week: Not on file    Minutes per session:  Not on file  . Stress: Not on file  Relationships  . Social Musicianconnections    Talks on phone: Not on file    Gets together: Not on file    Attends religious service: Not on file    Active member of club or organization: Not on file    Attends meetings of clubs or organizations: Not on file    Relationship status: Not on file  . Intimate partner violence    Fear of current or ex partner: Not on file    Emotionally abused: Not on file    Physically abused: Not on file    Forced sexual activity: Not on file  Other Topics Concern   . Not on file  Social History Narrative  . Not on file    Review of Systems  Constitutional: Negative.  Negative for chills and fever.  HENT: Negative.  Negative for congestion and sore throat.   Eyes: Negative.   Respiratory: Negative.  Negative for cough and shortness of breath.   Cardiovascular: Negative.  Negative for chest pain and palpitations.  Gastrointestinal: Negative.  Negative for abdominal pain, blood in stool, nausea and vomiting.  Genitourinary: Negative for dysuria and hematuria.  Musculoskeletal: Negative for back pain, myalgias and neck pain.  Skin: Negative.  Negative for rash.  Neurological: Negative.  Negative for headaches.  Endo/Heme/Allergies: Negative.   Psychiatric/Behavioral: The patient is nervous/anxious.   All other systems reviewed and are negative.   Objective   Vitals as reported by the patient: Today's Vitals   09/24/18 1041  Weight: 150 lb (68 kg)  Height: 5' 4.5" (1.638 m)   Alert and oriented x3 in no apparent respiratory distress. Diagnoses and all orders for this visit:  Social anxiety disorder -     sertraline (ZOLOFT) 25 MG tablet; Take 1 tablet (25 mg total) by mouth daily.  Situational anxiety  Elevated blood-pressure reading, without diagnosis of hypertension   Clinically stable.  No red flag signs or symptoms.  Elevated blood pressure reading most likely secondary to heightened state of anxiety.  Will address situational anxiety first, monitor blood pressure daily, and modify treatment as needed.  Advised to contact the office if clinical picture changes or she gets worse. Follow-up in 2 to 3 weeks.   I discussed the assessment and treatment plan with the patient. The patient was provided an opportunity to ask questions and all were answered. The patient agreed with the plan and demonstrated an understanding of the instructions.   The patient was advised to call back or seek an in-person evaluation if the symptoms worsen or  if the condition fails to improve as anticipated.  I provided 15 minutes of non-face-to-face time during this encounter.  Georgina QuintMiguel Jose Katricia Prehn, MD  [facility]

## 2018-09-27 ENCOUNTER — Ambulatory Visit (INDEPENDENT_AMBULATORY_CARE_PROVIDER_SITE_OTHER): Payer: BC Managed Care – PPO

## 2018-09-27 ENCOUNTER — Encounter (HOSPITAL_COMMUNITY): Payer: Self-pay | Admitting: Emergency Medicine

## 2018-09-27 ENCOUNTER — Ambulatory Visit (HOSPITAL_COMMUNITY)
Admission: EM | Admit: 2018-09-27 | Discharge: 2018-09-27 | Disposition: A | Discharge status: Home / Self Care | Payer: BC Managed Care – PPO | Attending: Family Medicine | Admitting: Family Medicine

## 2018-09-27 DIAGNOSIS — I10 Essential (primary) hypertension: Secondary | ICD-10-CM

## 2018-09-27 LAB — POCT URINALYSIS DIP (DEVICE)
Bilirubin Urine: NEGATIVE
Glucose, UA: NEGATIVE mg/dL
Ketones, ur: NEGATIVE mg/dL
Leukocytes,Ua: NEGATIVE
Nitrite: NEGATIVE
Protein, ur: NEGATIVE mg/dL
Specific Gravity, Urine: 1.025 (ref 1.005–1.030)
Urobilinogen, UA: 0.2 mg/dL (ref 0.0–1.0)
pH: 7 (ref 5.0–8.0)

## 2018-09-27 LAB — BASIC METABOLIC PANEL
Anion gap: 8 (ref 5–15)
BUN: 13 mg/dL (ref 8–23)
CO2: 26 mmol/L (ref 22–32)
Calcium: 9 mg/dL (ref 8.9–10.3)
Chloride: 107 mmol/L (ref 98–111)
Creatinine, Ser: 0.93 mg/dL (ref 0.44–1.00)
GFR calc Af Amer: >60 mL/min (ref >60)
GFR calc non Af Amer: >60 mL/min (ref >60)
Glucose, Bld: 101 mg/dL — ABNORMAL HIGH (ref 70–99)
Potassium: 4 mmol/L (ref 3.5–5.1)
Sodium: 141 mmol/L (ref 135–145)

## 2018-09-27 MED ORDER — LOSARTAN POTASSIUM-HCTZ 50-12.5 MG PO TABS: 1.0000 | ORAL_TABLET | Freq: Every day | ORAL | 0 refills | Status: DC

## 2018-09-27 NOTE — Discharge Instructions (Addendum)
Take the BP medicine daily Call for problems Call and make an appointment with Dr Mitchel Honour as a new patient for evaluation Continue to eat well and take good care of yourself

## 2018-09-27 NOTE — ED Triage Notes (Signed)
Pt states shes been under a lot of stress. Pt states shes been checking her BP for the last few weeks, has been running in the upper 200s. Pt states her only symptom is her "eyes just dont feel right". Denies vision changes, denies headache, dizziness. No neurological symptoms.

## 2018-09-27 NOTE — ED Provider Notes (Addendum)
[facility]    CSN: [id number] Arrival date & time: 09/27/18  1423     History   Chief Complaint Chief Complaint  Patient presents with  . Hypertension    HPI Felicia BlakeLinda C Squier is a 73 y.o. female.   HPI  Patient states that she does not have a PCP although she did talk to Dr. Alvy BimlerSagardia on 09/24/2018.  She is seeing him on a couple of occasions.  This is her husband's PCP.  I recommended that she make an appointment with him to establish medical care, and see him on a regular basis.  She states that she is healthy.  Dr. Alvy BimlerSagardia thought she had anxiety so started her on Zoloft 2 days ago.  She has not yet started this. Patient states that she has had elevated blood pressure that is getting worse over time.  Has been going on for a couple of months.  She has 2 blood pressure cuffs at home.  She will take her blood pressure with one and then the other to confirm.  She states consistently for the last couple of weeks her blood pressures have been running in the 180-200 range over 90-110 .  She states when her blood pressure is very high which she feels a hot or pressure sensation in her forehead and behind her eyes.  No visual symptoms.  No nausea.  No headache.  No stroke symptoms, numbness or weakness in arms or legs.   She has a lot of stress at work.  She works for the school system.  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  She thinks this will interfere with her second job.  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  At the age of 73 she does not feel able to do this. Patient states that both of her parents and all of her siblings have high blood pressure.  Past Medical History:  Diagnosis Date  . Cystocele with prolapse    VAULT PROLAPSE  . History of kidney stones   . Macular degeneration BOTH EYES    Patient Active Problem List   Diagnosis Date Noted  . Cystocele with prolapse 12/09/2017    Past  Surgical History:  Procedure Laterality Date  . BREAST ENHANCEMENT SURGERY  2007  . CYSTOCELE REPAIR N/A 12/09/2017   Procedure: ANTERIOR REPAIR (CYSTOCELE);  Surgeon: Alfredo MartinezMacDiarmid, Scott, MD;  Location: [facility];  Service: Urology;  Laterality: N/A;  . CYSTOSCOPY N/A 12/09/2017   Procedure: CYSTOSCOPY;  Surgeon: Alfredo MartinezMacDiarmid, Scott, MD;  Location: [facility];  Service: Urology;  Laterality: N/A;  . NECK MASS EXCISION  2012  . TUBAL LIGATION  1984  . VAGINAL HYSTERECTOMY  2007   PARTIAL, AND BLADDER TACH DONE  . VAGINAL PROLAPSE REPAIR N/A 12/09/2017   Procedure: VAGINAL VAULT PROLAPSE REPAIR WITH GRAFT;  Surgeon: Alfredo MartinezMacDiarmid, Scott, MD;  Location: [facility];  Service: Urology;  Laterality: N/A;    OB History   No obstetric history on file.      Home Medications    Prior to Admission medications   Medication Sig Start Date End Date Taking? Authorizing Provider  losartan-hydrochlorothiazide (HYZAAR) 50-12.5 MG tablet Take 1 tablet by mouth daily. 09/27/18   Eustace MooreNelson, Ciji Boston Sue, MD  multivitamin-lutein [facility](OCUVITE-LUTEIN) CAPS capsule Take 1 capsule by mouth daily.    [provider]  sertraline (ZOLOFT) 25 MG tablet Take 1 tablet (25 mg total) by  mouth daily. 09/24/18   Horald Pollen, MD    Family History Family History  Problem Relation Age of Onset  . Stroke Father   . Colon cancer Neg Hx   Hypertension both parents, all siblings  Social History Social History   Tobacco Use  . Smoking status: Never Smoker  . Smokeless tobacco: Never Used  Substance Use Topics  . Alcohol use: No  . Drug use: No     Allergies   Patient has no known allergies.   Review of Systems Review of Systems  Constitutional: Negative for chills and fever.  HENT: Negative for ear pain and sore throat.   Eyes: Positive for visual disturbance. Negative for pain.       At times  Respiratory: Negative for cough and shortness of breath.   Cardiovascular: Negative for chest pain and palpitations.   Gastrointestinal: Negative for abdominal pain and vomiting.  Genitourinary: Negative for dysuria and hematuria.  Musculoskeletal: Negative for arthralgias and back pain.  Skin: Negative for color change and rash.  Neurological: Negative for seizures and syncope.  All other systems reviewed and are negative.    Physical Exam Triage Vital Signs ED Triage Vitals [09/27/18 1534]  Enc Vitals Group     BP (!) 181/121     Pulse Rate 86     Resp 18     Temp 98.5 F (36.9 C)     Temp src      SpO2 96 %     Weight      Height      Head Circumference      Peak Flow      Pain Score 0     Pain Loc      Pain Edu?      Excl. in [place]?    No data found.  Updated Vital Signs BP (!) 181/121   Pulse 86   Temp 98.5 F (36.9 C)   Resp 18   SpO2 96%      Physical Exam Constitutional:      General: She is not in acute distress.    Appearance: She is well-developed.  HENT:     Head: Normocephalic and atraumatic.     Nose: Nose normal.     Mouth/Throat:     Mouth: Mucous membranes are moist.  Eyes:     Conjunctiva/sclera: Conjunctivae normal.     Pupils: Pupils are equal, round, and reactive to light.     Comments: Fundi show AV nicking  Neck:     Musculoskeletal: Normal range of motion.     Comments: Hyperdynamic pulse, visible at jugular Cardiovascular:     Rate and Rhythm: Normal rate and regular rhythm.     Heart sounds: Normal heart sounds.  Pulmonary:     Effort: Pulmonary effort is normal. No respiratory distress.     Breath sounds: Normal breath sounds.  Abdominal:     General: Abdomen is flat. There is no distension.     Palpations: Abdomen is soft.     Comments: No organomegaly  Musculoskeletal: Normal range of motion.     Right lower leg: No edema.     Left lower leg: No edema.  Skin:    General: Skin is warm and dry.  Neurological:     Mental Status: She is alert.  Psychiatric:     Comments: Moderately hyperkinetic/anxious      UC Treatments / Results   Labs (all labs ordered are listed, but only abnormal results  are displayed) Labs Reviewed  BASIC METABOLIC PANEL - Abnormal; Notable for the following components:      Result Value   Glucose, Bld 101 (*)    All other components within normal limits  POCT URINALYSIS DIP (DEVICE) - Abnormal; Notable for the following components:   Hgb urine dipstick TRACE (*)    All other components within normal limits    EKG- NSR, normal intervals. No ST or T wave changes   Radiology Dg Chest 2 View  Result Date: 09/27/2018 CLINICAL DATA:  Hypertension. EXAM: CHEST - 2 VIEW COMPARISON:  None FINDINGS: The heart size is normal. No pleural effusion or edema. No airspace opacities. Scar versus atelectasis identified within the left base. The visualized osseous structures are unremarkable. IMPRESSION: 1. Left base scar versus atelectasis. 2. No pneumonia. Electronically Signed   By: Signa Kellaylor  Stroud M.D.   On: 09/27/2018 16:35    Procedures Procedures (including critical care time)  Medications Ordered in UC Medications - No data to display  Initial Impression / Assessment and Plan / UC Course  I have reviewed the triage vital signs and the nursing notes.  Pertinent labs & imaging results that were available during my care of the patient were reviewed by me and considered in my medical decision making (see chart for details).     Patient does have multiple elevated readings over multiple days longer than 3 weeks.  I looked at her old chart and there were some isolated numbers of 140/85 during prior visits although most of the time it is normal.  She does have a strong family history of hypertension. I advised her that the EKG and CXR do not show any evidence of secondary disease from the HTN.    Final Clinical Impressions(s) / UC Diagnoses   Final diagnoses:  Essential hypertension     Discharge Instructions     Take the BP medicine daily Call for problems Call and make an appointment with  Dr Alvy BimlerSagardia as a new patient for evaluation Continue to eat well and take good care of yourself    ED Prescriptions    Medication Sig Dispense Auth. Provider   losartan-hydrochlorothiazide (HYZAAR) 50-12.5 MG tablet Take 1 tablet by mouth daily. 30 tablet Eustace MooreNelson, Asherah Lavoy Sue, MD     Controlled Substance Prescriptions [place] Controlled Substance Registry consulted? Not Applicable   Eustace MooreNelson, Roxanna Mcever Sue, MD 09/27/18 1706    Eustace MooreNelson, Kaliq Lege Sue, MD 09/27/18 [phone]

## 2018-10-26 ENCOUNTER — Telehealth: Payer: Self-pay | Admitting: Obstetrics and Gynecology

## 2018-10-26 NOTE — Telephone Encounter (Signed)
Pt requesting losartan-hydrochlorothiazide  . Please advise if pt requires ov Pt states medication is working very well for her, but it was prescribed by an urgent care provider Pt out of medication now

## 2018-10-28 ENCOUNTER — Ambulatory Visit (INDEPENDENT_AMBULATORY_CARE_PROVIDER_SITE_OTHER): Payer: BC Managed Care – PPO | Admitting: Emergency Medicine

## 2018-10-28 ENCOUNTER — Other Ambulatory Visit: Payer: Self-pay

## 2018-10-28 ENCOUNTER — Encounter: Payer: Self-pay | Admitting: Emergency Medicine

## 2018-10-28 VITALS — BP 153/84 | HR 71 | Temp 97.9°F | Resp 16 | Ht 64.5 in | Wt 149.8 lb

## 2018-10-28 DIAGNOSIS — I10 Essential (primary) hypertension: Secondary | ICD-10-CM | POA: Diagnosis not present

## 2018-10-28 DIAGNOSIS — F418 Other specified anxiety disorders: Secondary | ICD-10-CM

## 2018-10-28 MED ORDER — LOSARTAN POTASSIUM-HCTZ 50-12.5 MG PO TABS: 1.0000 | ORAL_TABLET | Freq: Every day | ORAL | 3 refills | Status: DC

## 2018-10-28 NOTE — Progress Notes (Signed)
BP Readings from Last 3 Encounters:  10/28/18 (!) 153/84  09/27/18 (!) 181/121  12/10/17 121/75   Jaclyn ShaggyLinda C Joles 73 y.o.   Chief Complaint  Patient presents with  . Medication Refill    losartan-hctz    HISTORY OF PRESENT ILLNESS: This is a 73 y.o. female recently started on Hyzaar 50-12 0.5 for uncontrolled hypertension.  Doing well.  Has no complaints or medical concerns today.  Needs medication refill. Last visit with me 09/24/2018 was started on Zoloft 25 mg for situational anxiety.  Tolerating medication well.  HPI   Prior to Admission medications   Medication Sig Start Date End Date Taking? Authorizing Provider  losartan-hydrochlorothiazide (HYZAAR) 50-12.5 MG tablet Take 1 tablet by mouth daily. 09/27/18  Yes Eustace MooreNelson, Yvonne Sue, MD  multivitamin-lutein [facility](OCUVITE-LUTEIN) CAPS capsule Take 1 capsule by mouth daily.   Yes [provider]  sertraline (ZOLOFT) 25 MG tablet Take 1 tablet (25 mg total) by mouth daily. 09/24/18  Yes SagardiaEilleen Kempf, Doneta Bayman Jose, MD    No Known Allergies  Patient Active Problem List   Diagnosis Date Noted  . Cystocele with prolapse 12/09/2017    Past Medical History:  Diagnosis Date  . Cystocele with prolapse    VAULT PROLAPSE  . History of kidney stones   . Macular degeneration BOTH EYES    Past Surgical History:  Procedure Laterality Date  . BREAST ENHANCEMENT SURGERY  2007  . CYSTOCELE REPAIR N/A 12/09/2017   Procedure: ANTERIOR REPAIR (CYSTOCELE);  Surgeon: Alfredo MartinezMacDiarmid, Scott, MD;  Location: [facility];  Service: Urology;  Laterality: N/A;  . CYSTOSCOPY N/A 12/09/2017   Procedure: CYSTOSCOPY;  Surgeon: Alfredo MartinezMacDiarmid, Scott, MD;  Location: [facility];  Service: Urology;  Laterality: N/A;  . NECK MASS EXCISION  2012  . TUBAL LIGATION  1984  . VAGINAL HYSTERECTOMY  2007   PARTIAL, AND BLADDER TACH DONE  . VAGINAL PROLAPSE REPAIR N/A 12/09/2017   Procedure: VAGINAL VAULT PROLAPSE REPAIR WITH GRAFT;  Surgeon: Alfredo MartinezMacDiarmid, Scott, MD;  Location:  [facility];  Service: Urology;  Laterality: N/A;    Social History   Socioeconomic History  . Marital status: Married    Spouse name: Not on file  . Number of children: Not on file  . Years of education: Not on file  . Highest education level: Not on file  Occupational History  . Not on file  Social Needs  . Financial resource strain: Not on file  . Food insecurity    Worry: Not on file    Inability: Not on file  . Transportation needs    Medical: Not on file    Non-medical: Not on file  Tobacco Use  . Smoking status: Never Smoker  . Smokeless tobacco: Never Used  Substance and Sexual Activity  . Alcohol use: No  . Drug use: No  . Sexual activity: Not on file  Lifestyle  . Physical activity    Days per week: Not on file    Minutes per session: Not on file  . Stress: Not on file  Relationships  . Social Musicianconnections    Talks on phone: Not on file    Gets together: Not on file    Attends religious service: Not on file    Active member of club or organization: Not on file    Attends meetings of clubs or organizations: Not on file    Relationship status: Not on file  . Intimate partner violence    Fear of current or ex partner: Not on  file    Emotionally abused: Not on file    Physically abused: Not on file    Forced sexual activity: Not on file  Other Topics Concern  . Not on file  Social History Narrative  . Not on file    Family History  Problem Relation Age of Onset  . Stroke Father   . Colon cancer Neg Hx      Review of Systems  Constitutional: Negative.  Negative for chills and fever.  HENT: Negative for congestion and sore throat.   Eyes: Negative.   Respiratory: Negative.  Negative for cough and shortness of breath.   Cardiovascular: Negative.  Negative for chest pain and palpitations.  Gastrointestinal: Negative for abdominal pain, nausea and vomiting.  Genitourinary: Negative.  Negative for dysuria.  Skin: Negative for rash.  Neurological:  Negative.  Negative for dizziness and headaches.  Endo/Heme/Allergies: Negative.   All other systems reviewed and are negative.  Today's Vitals   10/28/18 1559  BP: (!) 153/84  Pulse: 71  Resp: 16  Temp: 97.9 F (36.6 C)  TempSrc: Oral  SpO2: 97%  Weight: 149 lb 12.8 oz (67.9 kg)  Height: 5' 4.5" (1.638 m)   Body mass index is 25.32 kg/m.   Physical Exam Vitals signs reviewed.  Constitutional:      Appearance: Normal appearance.  HENT:     Head: Normocephalic.  Eyes:     Extraocular Movements: Extraocular movements intact.     Pupils: Pupils are equal, round, and reactive to light.  Neck:     Musculoskeletal: Normal range of motion and neck supple.  Cardiovascular:     Rate and Rhythm: Normal rate and regular rhythm.     Pulses: Normal pulses.     Heart sounds: Normal heart sounds.  Pulmonary:     Effort: Pulmonary effort is normal.     Breath sounds: Normal breath sounds.  Musculoskeletal: Normal range of motion.  Skin:    General: Skin is warm and dry.     Capillary Refill: Capillary refill takes less than 2 seconds.  Neurological:     General: No focal deficit present.     Mental Status: She is alert and oriented to person, place, and time.  Psychiatric:        Mood and Affect: Mood normal.        Behavior: Behavior normal.    A total of 25 minutes was spent in the room with the patient, greater than 50% of which was in counseling/coordination of care regarding hypertension and cardiovascular risks associated with it, management, diet and nutrition, medication side effects, prognosis, and need for follow-up.   ASSESSMENT & PLAN: Jalaila was seen today for medication refill.  Diagnoses and all orders for this visit:  Essential hypertension -     losartan-hydrochlorothiazide (HYZAAR) 50-12.5 MG tablet; Take 1 tablet by mouth daily.  Situational anxiety    Patient Instructions       If you have lab work done today you will be contacted with your  lab results within the next 2 weeks.  If you have not heard from [place] then please contact us. The fastest way to get your results is to register for My Chart.   IF you received an x-ray today, you will receive an invoice from [facility]. Please contact [facility] at [phone] with questions or concerns regarding your invoice.   IF you received labwork today, you will receive an invoice from [place]. Please contact [facility] at [phone] with  questions or concerns regarding your invoice.   Our billing staff will not be able to assist you with questions regarding bills from these companies.  You will be contacted with the lab results as soon as they are available. The fastest way to get your results is to activate your My Chart account. Instructions are located on the last page of this paperwork. If you have not heard from [place] regarding the results in 2 weeks, please contact this office.     Hypertension, Adult High blood pressure (hypertension) is when the force of blood pumping through the arteries is too strong. The arteries are the blood vessels that carry blood from the heart throughout the body. Hypertension forces the heart to work harder to pump blood and may cause arteries to become narrow or stiff. Untreated or uncontrolled hypertension can cause a heart attack, heart failure, a stroke, kidney disease, and other problems. A blood pressure reading consists of a higher number over a lower number. Ideally, your blood pressure should be below 120/80. The first ("top") number is called the systolic pressure. It is a measure of the pressure in your arteries as your heart beats. The second ("bottom") number is called the diastolic pressure. It is a measure of the pressure in your arteries as the heart relaxes. What are the causes? The exact cause of this condition is not known. There are some conditions that result in or are related to high blood pressure. What increases the  risk? Some risk factors for high blood pressure are under your control. The following factors may make you more likely to develop this condition:  Smoking.  Having type 2 diabetes mellitus, high cholesterol, or both.  Not getting enough exercise or physical activity.  Being overweight.  Having too much fat, sugar, calories, or salt (sodium) in your diet.  Drinking too much alcohol. Some risk factors for high blood pressure may be difficult or impossible to change. Some of these factors include:  Having chronic kidney disease.  Having a family history of high blood pressure.  Age. Risk increases with age.  Race. You may be at higher risk if you are African American.  Gender. Men are at higher risk than women before age 6. After age 31, women are at higher risk than men.  Having obstructive sleep apnea.  Stress. What are the signs or symptoms? High blood pressure may not cause symptoms. Very high blood pressure (hypertensive crisis) may cause:  Headache.  Anxiety.  Shortness of breath.  Nosebleed.  Nausea and vomiting.  Vision changes.  Severe chest pain.  Seizures. How is this diagnosed? This condition is diagnosed by measuring your blood pressure while you are seated, with your arm resting on a flat surface, your legs uncrossed, and your feet flat on the floor. The cuff of the blood pressure monitor will be placed directly against the skin of your upper arm at the level of your heart. It should be measured at least twice using the same arm. Certain conditions can cause a difference in blood pressure between your right and left arms. Certain factors can cause blood pressure readings to be lower or higher than normal for a short period of time:  When your blood pressure is higher when you are in a health care provider's office than when you are at home, this is called white coat hypertension. Most people with this condition do not need medicines.  When your blood  pressure is higher at home than when you are  in a health care provider's office, this is called masked hypertension. Most people with this condition may need medicines to control blood pressure. If you have a high blood pressure reading during one visit or you have normal blood pressure with other risk factors, you may be asked to:  Return on a different day to have your blood pressure checked again.  Monitor your blood pressure at home for 1 week or longer. If you are diagnosed with hypertension, you may have other blood or imaging tests to help your health care provider understand your overall risk for other conditions. How is this treated? This condition is treated by making healthy lifestyle changes, such as eating healthy foods, exercising more, and reducing your alcohol intake. Your health care provider may prescribe medicine if lifestyle changes are not enough to get your blood pressure under control, and if:  Your systolic blood pressure is above 130.  Your diastolic blood pressure is above 80. Your personal target blood pressure may vary depending on your medical conditions, your age, and other factors. Follow these instructions at home: Eating and drinking   Eat a diet that is high in fiber and potassium, and low in sodium, added sugar, and fat. An example eating plan is called the DASH (Dietary Approaches to Stop Hypertension) diet. To eat this way: ? Eat plenty of fresh fruits and vegetables. Try to fill one half of your plate at each meal with fruits and vegetables. ? Eat whole grains, such as whole-wheat pasta, brown rice, or whole-grain bread. Fill about one fourth of your plate with whole grains. ? Eat or drink low-fat dairy products, such as skim milk or low-fat yogurt. ? Avoid fatty cuts of meat, processed or cured meats, and poultry with skin. Fill about one fourth of your plate with lean proteins, such as fish, chicken without skin, beans, eggs, or tofu. ? Avoid pre-made  and processed foods. These tend to be higher in sodium, added sugar, and fat.  Reduce your daily sodium intake. Most people with hypertension should eat less than 1,500 mg of sodium a day.  Do not drink alcohol if: ? Your health care provider tells you not to drink. ? You are pregnant, may be pregnant, or are planning to become pregnant.  If you drink alcohol: ? Limit how much you use to:  0-1 drink a day for women.  0-2 drinks a day for men. ? Be aware of how much alcohol is in your drink. In the [place] one drink equals one 12 oz bottle of beer (355 mL), one 5 oz glass of wine (148 mL), or one 1 oz glass of hard liquor (44 mL). Lifestyle   Work with your health care provider to maintain a healthy body weight or to lose weight. Ask what an ideal weight is for you.  Get at least 30 minutes of exercise most days of the week. Activities may include walking, swimming, or biking.  Include exercise to strengthen your muscles (resistance exercise), such as Pilates or lifting weights, as part of your weekly exercise routine. Try to do these types of exercises for 30 minutes at least 3 days a week.  Do not use any products that contain nicotine or tobacco, such as cigarettes, e-cigarettes, and chewing tobacco. If you need help quitting, ask your health care provider.  Monitor your blood pressure at home as told by your health care provider.  Keep all follow-up visits as told by your health care provider. This is important. Medicines  Take over-the-counter and prescription medicines only as told by your health care provider. Follow directions carefully. Blood pressure medicines must be taken as prescribed.  Do not skip doses of blood pressure medicine. Doing this puts you at risk for problems and can make the medicine less effective.  Ask your health care provider about side effects or reactions to medicines that you should watch for. Contact a health care provider if you:  Think you are  having a reaction to a medicine you are taking.  Have headaches that keep coming back (recurring).  Feel dizzy.  Have swelling in your ankles.  Have trouble with your vision. Get help right away if you:  Develop a severe headache or confusion.  Have unusual weakness or numbness.  Feel faint.  Have severe pain in your chest or abdomen.  Vomit repeatedly.  Have trouble breathing. Summary  Hypertension is when the force of blood pumping through your arteries is too strong. If this condition is not controlled, it may put you at risk for serious complications.  Your personal target blood pressure may vary depending on your medical conditions, your age, and other factors. For most people, a normal blood pressure is less than 120/80.  Hypertension is treated with lifestyle changes, medicines, or a combination of both. Lifestyle changes include losing weight, eating a healthy, low-sodium diet, exercising more, and limiting alcohol. This information is not intended to replace advice given to you by your health care provider. Make sure you discuss any questions you have with your health care provider. Document Released: 01/28/2005 Document Revised: 10/08/2017 Document Reviewed: 10/08/2017 Elsevier Patient Education  2020 Elsevier Inc.      Edwina Barth, MD [facility]

## 2018-10-28 NOTE — Patient Instructions (Addendum)
   If you have lab work done today you will be contacted with your lab results within the next 2 weeks.  If you have not heard from us then please contact us. The fastest way to get your results is to register for My Chart.   IF you received an x-ray today, you will receive an invoice from [facility]. Please contact [facility] at [phone] with questions or concerns regarding your invoice.   IF you received labwork today, you will receive an invoice from [facility]. Please contact [facility] at [phone] with questions or concerns regarding your invoice.   Our billing staff will not be able to assist you with questions regarding bills from these companies.  You will be contacted with the lab results as soon as they are available. The fastest way to get your results is to activate your My Chart account. Instructions are located on the last page of this paperwork. If you have not heard from us regarding the results in 2 weeks, please contact this office.     Hypertension, Adult High blood pressure (hypertension) is when the force of blood pumping through the arteries is too strong. The arteries are the blood vessels that carry blood from the heart throughout the body. Hypertension forces the heart to work harder to pump blood and may cause arteries to become narrow or stiff. Untreated or uncontrolled hypertension can cause a heart attack, heart failure, a stroke, kidney disease, and other problems. A blood pressure reading consists of a higher number over a lower number. Ideally, your blood pressure should be below 120/80. The first ("top") number is called the systolic pressure. It is a measure of the pressure in your arteries as your heart beats. The second ("bottom") number is called the diastolic pressure. It is a measure of the pressure in your arteries as the heart relaxes. What are the causes? The exact cause of this condition is not known. There are some conditions  that result in or are related to high blood pressure. What increases the risk? Some risk factors for high blood pressure are under your control. The following factors may make you more likely to develop this condition:  Smoking.  Having type 2 diabetes mellitus, high cholesterol, or both.  Not getting enough exercise or physical activity.  Being overweight.  Having too much fat, sugar, calories, or salt (sodium) in your diet.  Drinking too much alcohol. Some risk factors for high blood pressure may be difficult or impossible to change. Some of these factors include:  Having chronic kidney disease.  Having a family history of high blood pressure.  Age. Risk increases with age.  Race. You may be at higher risk if you are African American.  Gender. Men are at higher risk than women before age 45. After age 65, women are at higher risk than men.  Having obstructive sleep apnea.  Stress. What are the signs or symptoms? High blood pressure may not cause symptoms. Very high blood pressure (hypertensive crisis) may cause:  Headache.  Anxiety.  Shortness of breath.  Nosebleed.  Nausea and vomiting.  Vision changes.  Severe chest pain.  Seizures. How is this diagnosed? This condition is diagnosed by measuring your blood pressure while you are seated, with your arm resting on a flat surface, your legs uncrossed, and your feet flat on the floor. The cuff of the blood pressure monitor will be placed directly against the skin of your upper arm at the level of your heart.   It should be measured at least twice using the same arm. Certain conditions can cause a difference in blood pressure between your right and left arms. Certain factors can cause blood pressure readings to be lower or higher than normal for a short period of time:  When your blood pressure is higher when you are in a health care provider's office than when you are at home, this is called white coat hypertension.  Most people with this condition do not need medicines.  When your blood pressure is higher at home than when you are in a health care provider's office, this is called masked hypertension. Most people with this condition may need medicines to control blood pressure. If you have a high blood pressure reading during one visit or you have normal blood pressure with other risk factors, you may be asked to:  Return on a different day to have your blood pressure checked again.  Monitor your blood pressure at home for 1 week or longer. If you are diagnosed with hypertension, you may have other blood or imaging tests to help your health care provider understand your overall risk for other conditions. How is this treated? This condition is treated by making healthy lifestyle changes, such as eating healthy foods, exercising more, and reducing your alcohol intake. Your health care provider may prescribe medicine if lifestyle changes are not enough to get your blood pressure under control, and if:  Your systolic blood pressure is above 130.  Your diastolic blood pressure is above 80. Your personal target blood pressure may vary depending on your medical conditions, your age, and other factors. Follow these instructions at home: Eating and drinking   Eat a diet that is high in fiber and potassium, and low in sodium, added sugar, and fat. An example eating plan is called the DASH (Dietary Approaches to Stop Hypertension) diet. To eat this way: ? Eat plenty of fresh fruits and vegetables. Try to fill one half of your plate at each meal with fruits and vegetables. ? Eat whole grains, such as whole-wheat pasta, brown rice, or whole-grain bread. Fill about one fourth of your plate with whole grains. ? Eat or drink low-fat dairy products, such as skim milk or low-fat yogurt. ? Avoid fatty cuts of meat, processed or cured meats, and poultry with skin. Fill about one fourth of your plate with lean proteins, such  as fish, chicken without skin, beans, eggs, or tofu. ? Avoid pre-made and processed foods. These tend to be higher in sodium, added sugar, and fat.  Reduce your daily sodium intake. Most people with hypertension should eat less than 1,500 mg of sodium a day.  Do not drink alcohol if: ? Your health care provider tells you not to drink. ? You are pregnant, may be pregnant, or are planning to become pregnant.  If you drink alcohol: ? Limit how much you use to:  0-1 drink a day for women.  0-2 drinks a day for men. ? Be aware of how much alcohol is in your drink. In the [place] one drink equals one 12 oz bottle of beer (355 mL), one 5 oz glass of wine (148 mL), or one 1 oz glass of hard liquor (44 mL). Lifestyle   Work with your health care provider to maintain a healthy body weight or to lose weight. Ask what an ideal weight is for you.  Get at least 30 minutes of exercise most days of the week. Activities may include walking, swimming, or   biking.  Include exercise to strengthen your muscles (resistance exercise), such as Pilates or lifting weights, as part of your weekly exercise routine. Try to do these types of exercises for 30 minutes at least 3 days a week.  Do not use any products that contain nicotine or tobacco, such as cigarettes, e-cigarettes, and chewing tobacco. If you need help quitting, ask your health care provider.  Monitor your blood pressure at home as told by your health care provider.  Keep all follow-up visits as told by your health care provider. This is important. Medicines  Take over-the-counter and prescription medicines only as told by your health care provider. Follow directions carefully. Blood pressure medicines must be taken as prescribed.  Do not skip doses of blood pressure medicine. Doing this puts you at risk for problems and can make the medicine less effective.  Ask your health care provider about side effects or reactions to medicines that you  should watch for. Contact a health care provider if you:  Think you are having a reaction to a medicine you are taking.  Have headaches that keep coming back (recurring).  Feel dizzy.  Have swelling in your ankles.  Have trouble with your vision. Get help right away if you:  Develop a severe headache or confusion.  Have unusual weakness or numbness.  Feel faint.  Have severe pain in your chest or abdomen.  Vomit repeatedly.  Have trouble breathing. Summary  Hypertension is when the force of blood pumping through your arteries is too strong. If this condition is not controlled, it may put you at risk for serious complications.  Your personal target blood pressure may vary depending on your medical conditions, your age, and other factors. For most people, a normal blood pressure is less than 120/80.  Hypertension is treated with lifestyle changes, medicines, or a combination of both. Lifestyle changes include losing weight, eating a healthy, low-sodium diet, exercising more, and limiting alcohol. This information is not intended to replace advice given to you by your health care provider. Make sure you discuss any questions you have with your health care provider. Document Released: 01/28/2005 Document Revised: 10/08/2017 Document Reviewed: 10/08/2017 Elsevier Patient Education  2020 [employer] Inc.  

## 2018-12-17 ENCOUNTER — Other Ambulatory Visit: Payer: Self-pay | Admitting: Emergency Medicine

## 2018-12-17 DIAGNOSIS — F4010 Social phobia, unspecified: Secondary | ICD-10-CM

## 2018-12-17 NOTE — Telephone Encounter (Signed)
Requested medication (s) are due for refill today: yes  Requested medication (s) are on the active medication list: yes  Last refill:  11/22/2018  Future visit scheduled: yes  Notes to clinic: patient requesting a 90 day supply with 2 refills   Requested Prescriptions  Pending Prescriptions Disp Refills   sertraline (ZOLOFT) 25 MG tablet [Pharmacy Med Name: SERTRALINE HCL 25 MG TABLET] 90 tablet 2    Sig: TAKE 1 TABLET BY MOUTH EVERY DAY     Psychiatry:  Antidepressants - SSRI Failed - 12/17/2018  1:31 PM      Failed - Completed PHQ-2 or PHQ-9 in the last 360 days.      Passed - Valid encounter within last 6 months    Recent Outpatient Visits          1 month ago Essential hypertension   [facility] [place], Ines Bloomer, MD   2 months ago Social anxiety disorder   [facility] [place], [place], MD   2 years ago Left foot pain   [facility] at [facility], Ines Bloomer, MD   2 years ago Acute non-recurrent maxillary sinusitis   [facility] [place], Ines Bloomer, MD   4 years ago Spider bite, accidental or unintentional, initial encounter   [facility], Ranell Patrick, MD      Future Appointments            In 4 months Sagardia, Ines Bloomer, MD [facility] [place], [facility]

## 2019-02-15 ENCOUNTER — Other Ambulatory Visit: Payer: BC Managed Care – PPO

## 2019-03-15 ENCOUNTER — Other Ambulatory Visit: Payer: Self-pay | Admitting: Emergency Medicine

## 2019-03-15 DIAGNOSIS — F4010 Social phobia, unspecified: Secondary | ICD-10-CM

## 2019-04-27 ENCOUNTER — Encounter: Payer: Self-pay | Admitting: Emergency Medicine

## 2019-04-27 ENCOUNTER — Ambulatory Visit: Payer: Medicare Other | Admitting: Emergency Medicine

## 2019-04-27 ENCOUNTER — Other Ambulatory Visit: Payer: Self-pay

## 2019-04-27 VITALS — BP 120/74 | HR 80 | Temp 98.7°F | Ht 64.5 in | Wt 152.0 lb

## 2019-04-27 DIAGNOSIS — I10 Essential (primary) hypertension: Secondary | ICD-10-CM | POA: Diagnosis not present

## 2019-04-27 NOTE — Progress Notes (Signed)
BP Readings from Last 3 Encounters:  04/27/19 120/74  10/28/18 (!) 153/84  09/27/18 (!) 181/121   [name] 73 y.o.   Chief Complaint  Patient presents with  . Hypertension    brought in log for bp for the past 64mos, does have some spikes, nothing she is concerned about    HISTORY OF PRESENT ILLNESS: This is a 74 y.o. female with history of hypertension here for follow-up. Presently taking Hyzaar 50-12.5 mg daily.  Doing well.  Blood pressure readings at home within normal limits. Has no complaints or medical concerns. Had Covid infection earlier this year.  Doing well and asymptomatic.  HPI   Prior to Admission medications   Medication Sig Start Date End Date Taking? Authorizing Provider  losartan-hydrochlorothiazide (HYZAAR) 50-12.5 MG tablet Take 1 tablet by mouth daily. 10/28/18 04/27/19 Yes Keyante Durio, Eilleen Kempf, MD  multivitamin-lutein [facility]) CAPS capsule Take 1 capsule by mouth daily.   Yes [provider]  sertraline (ZOLOFT) 25 MG tablet TAKE 1 TABLET BY MOUTH EVERY DAY 03/15/19  Yes Marco Adelson, Eilleen Kempf, MD    No Known Allergies  Patient Active Problem List   Diagnosis Date Noted  . Essential hypertension 10/28/2018  . Cystocele with prolapse 12/09/2017  . SUI (stress urinary incontinence, female) 07/17/2016  . 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 Cystocele with prolapse    VAULT PROLAPSE  . History of kidney stones   . Macular degeneration BOTH EYES    Past Surgical History:  Procedure Laterality Date  . BREAST ENHANCEMENT SURGERY  2007  . CYSTOCELE REPAIR N/A 12/09/2017   Procedure: ANTERIOR REPAIR (CYSTOCELE);  Surgeon: Alfredo Martinez, MD;  Location: [facility];  Service: Urology;  Laterality: N/A;  . CYSTOSCOPY N/A 12/09/2017   Procedure: CYSTOSCOPY;  Surgeon: Alfredo Martinez, MD;  Location: [facility];  Service: Urology;  Laterality: N/A;  . NECK MASS EXCISION  2012  . TUBAL LIGATION   1984  . VAGINAL HYSTERECTOMY  2007   PARTIAL, AND BLADDER TACH DONE  . VAGINAL PROLAPSE REPAIR N/A 12/09/2017   Procedure: VAGINAL VAULT PROLAPSE REPAIR WITH GRAFT;  Surgeon: Alfredo Martinez, MD;  Location: [facility];  Service: Urology;  Laterality: N/A;    Social History   Socioeconomic History  . Marital status: Married    Spouse name: Not on file  . Number of children: Not on file  . Years of education: Not on file  . Highest education level: Not on file  Occupational History  . Not on file  Tobacco Use  . Smoking status: Never Smoker  . Smokeless tobacco: Never Used  Substance and Sexual Activity  . Alcohol use: No  . Drug use: No  . Sexual activity: Not on file  Other Topics Concern  . Not on file  Social History Narrative  . Not on file   Social Determinants of Health   Financial Resource Strain:   . Difficulty of Paying Living Expenses:   Food Insecurity:   . Worried About Programme researcher, broadcasting/film/video in the Last Year:   . Barista in the Last Year:   Transportation Needs:   . Freight forwarder (Medical):   Marland Kitchen Lack of Transportation (Non-Medical):   Physical Activity:   . Days of Exercise per Week:   . Minutes of Exercise per Session:   Stress:   . Feeling of Stress :   Social Connections:   . Frequency of Communication with  Friends and Family:   . Frequency of Social Gatherings with Friends and Family:   . Attends Religious Services:   . Active Member of Clubs or Organizations:   . Attends Archivist Meetings:   Marland Kitchen Marital Status:   Intimate Partner Violence:   . Fear of Current or Ex-Partner:   . Emotionally Abused:   Marland Kitchen Physically Abused:   . Sexually Abused:     Family History  Problem Relation Age of Onset  . Stroke Father   . Colon cancer Neg Hx      Review of Systems  Constitutional: Negative.  Negative for chills and fever.  HENT: Negative.  Negative for congestion and sore throat.   Respiratory: Negative.  Negative for cough  and shortness of breath.   Cardiovascular: Negative.  Negative for chest pain and palpitations.  Gastrointestinal: Negative.  Negative for abdominal pain, blood in stool, diarrhea, nausea and vomiting.  Genitourinary: Negative.   Musculoskeletal: Negative.   Skin: Negative.  Negative for rash.  Neurological: Negative.  Negative for dizziness and headaches.  All other systems reviewed and are negative.  Today's Vitals   04/27/19 1034  BP: 120/74  Pulse: 80  Temp: 98.7 F (37.1 C)  SpO2: 97%  Weight: 152 lb (68.9 kg)  Height: 5' 4.5" (1.638 m)   Body mass index is 25.69 kg/m.   Physical Exam Vitals reviewed.  Constitutional:      Appearance: Normal appearance.  HENT:     Head: Normocephalic.  Eyes:     Extraocular Movements: Extraocular movements intact.     Conjunctiva/sclera: Conjunctivae normal.     Pupils: Pupils are equal, round, and reactive to light.  Cardiovascular:     Rate and Rhythm: Normal rate and regular rhythm.     Pulses: Normal pulses.     Heart sounds: Normal heart sounds.  Pulmonary:     Effort: Pulmonary effort is normal.     Breath sounds: Normal breath sounds.  Musculoskeletal:        General: Normal range of motion.     Cervical back: Normal range of motion and neck supple.  Skin:    General: Skin is warm and dry.  Neurological:     General: No focal deficit present.     Mental Status: She is alert and oriented to person, place, and time.  Psychiatric:        Mood and Affect: Mood normal.        Behavior: Behavior normal.      ASSESSMENT & PLAN: Clinically stable.  No medical concerns identified during this visit.  Continue present medications.  No changes.  Iolanda was seen today for hypertension.  Diagnoses and all orders for this visit:  Essential hypertension    Patient Instructions       If you have lab work done today you will be contacted with your lab results within the next 2 weeks.  If you have not heard from [place] then  please contact us. The fastest way to get your results is to register for My Chart.   IF you received an x-ray today, you will receive an invoice from [facility]. Please contact [facility] at [phone] with questions or concerns regarding your invoice.   IF you received labwork today, you will receive an invoice from [place]. Please contact [facility] at [phone] with questions or concerns regarding your invoice.   Our billing staff will not be able to assist you with questions regarding bills from these  companies.  You will be contacted with the lab results as soon as they are available. The fastest way to get your results is to activate your My Chart account. Instructions are located on the last page of this paperwork. If you have not heard from us regarding the results in 2 weeks, please contact this office.     Hypertension, Adult High blood pressure (hypertension) is when the force of blood pumping through the arteries is too strong. The arteries are the blood vessels that carry blood from the heart throughout the body. Hypertension forces the heart to work harder to pump blood and may cause arteries to become narrow or stiff. Untreated or uncontrolled hypertension can cause a heart attack, heart failure, a stroke, kidney disease, and other problems. A blood pressure reading consists of a higher number over a lower number. Ideally, your blood pressure should be below 120/80. The first ("top") number is called the systolic pressure. It is a measure of the pressure in your arteries as your heart beats. The second ("bottom") number is called the diastolic pressure. It is a measure of the pressure in your arteries as the heart relaxes. What are the causes? The exact cause of this condition is not known. There are some conditions that result in or are related to high blood pressure. What increases the risk? Some risk factors for high blood pressure are under your control.  The following factors may make you more likely to develop this condition:  Smoking.  Having type 2 diabetes mellitus, high cholesterol, or both.  Not getting enough exercise or physical activity.  Being overweight.  Having too much fat, sugar, calories, or salt (sodium) in your diet.  Drinking too much alcohol. Some risk factors for high blood pressure may be difficult or impossible to change. Some of these factors include:  Having chronic kidney disease.  Having a family history of high blood pressure.  Age. Risk increases with age.  Race. You may be at higher risk if you are African American.  Gender. Men are at higher risk than women before age 72. After age 10465, women are at higher risk than men.  Having obstructive sleep apnea.  Stress. What are the signs or symptoms? High blood pressure may not cause symptoms. Very high blood pressure (hypertensive crisis) may cause:  Headache.  Anxiety.  Shortness of breath.  Nosebleed.  Nausea and vomiting.  Vision changes.  Severe chest pain.  Seizures. How is this diagnosed? This condition is diagnosed by measuring your blood pressure while you are seated, with your arm resting on a flat surface, your legs uncrossed, and your feet flat on the floor. The cuff of the blood pressure monitor will be placed directly against the skin of your upper arm at the level of your heart. It should be measured at least twice using the same arm. Certain conditions can cause a difference in blood pressure between your right and left arms. Certain factors can cause blood pressure readings to be lower or higher than normal for a short period of time:  When your blood pressure is higher when you are in a health care provider's office than when you are at home, this is called white coat hypertension. Most people with this condition do not need medicines.  When your blood pressure is higher at home than when you are in a health care provider's  office, this is called masked hypertension. Most people with this condition may need medicines to control blood pressure. If  you have a high blood pressure reading during one visit or you have normal blood pressure with other risk factors, you may be asked to:  Return on a different day to have your blood pressure checked again.  Monitor your blood pressure at home for 1 week or longer. If you are diagnosed with hypertension, you may have other blood or imaging tests to help your health care provider understand your overall risk for other conditions. How is this treated? This condition is treated by making healthy lifestyle changes, such as eating healthy foods, exercising more, and reducing your alcohol intake. Your health care provider may prescribe medicine if lifestyle changes are not enough to get your blood pressure under control, and if:  Your systolic blood pressure is above 130.  Your diastolic blood pressure is above 80. Your personal target blood pressure may vary depending on your medical conditions, your age, and other factors. Follow these instructions at home: Eating and drinking   Eat a diet that is high in fiber and potassium, and low in sodium, added sugar, and fat. An example eating plan is called the DASH (Dietary Approaches to Stop Hypertension) diet. To eat this way: ? Eat plenty of fresh fruits and vegetables. Try to fill one half of your plate at each meal with fruits and vegetables. ? Eat whole grains, such as whole-wheat pasta, brown rice, or whole-grain bread. Fill about one fourth of your plate with whole grains. ? Eat or drink low-fat dairy products, such as skim milk or low-fat yogurt. ? Avoid fatty cuts of meat, processed or cured meats, and poultry with skin. Fill about one fourth of your plate with lean proteins, such as fish, chicken without skin, beans, eggs, or tofu. ? Avoid pre-made and processed foods. These tend to be higher in sodium, added sugar, and  fat.  Reduce your daily sodium intake. Most people with hypertension should eat less than 1,500 mg of sodium a day.  Do not drink alcohol if: ? Your health care provider tells you not to drink. ? You are pregnant, may be pregnant, or are planning to become pregnant.  If you drink alcohol: ? Limit how much you use to:  0-1 drink a day for women.  0-2 drinks a day for men. ? Be aware of how much alcohol is in your drink. In the [place] one drink equals one 12 oz bottle of beer (355 mL), one 5 oz glass of wine (148 mL), or one 1 oz glass of hard liquor (44 mL). Lifestyle   Work with your health care provider to maintain a healthy body weight or to lose weight. Ask what an ideal weight is for you.  Get at least 30 minutes of exercise most days of the week. Activities may include walking, swimming, or biking.  Include exercise to strengthen your muscles (resistance exercise), such as Pilates or lifting weights, as part of your weekly exercise routine. Try to do these types of exercises for 30 minutes at least 3 days a week.  Do not use any products that contain nicotine or tobacco, such as cigarettes, e-cigarettes, and chewing tobacco. If you need help quitting, ask your health care provider.  Monitor your blood pressure at home as told by your health care provider.  Keep all follow-up visits as told by your health care provider. This is important. Medicines  Take over-the-counter and prescription medicines only as told by your health care provider. Follow directions carefully. Blood pressure medicines must be taken as  prescribed.  Do not skip doses of blood pressure medicine. Doing this puts you at risk for problems and can make the medicine less effective.  Ask your health care provider about side effects or reactions to medicines that you should watch for. Contact a health care provider if you:  Think you are having a reaction to a medicine you are taking.  Have headaches that  keep coming back (recurring).  Feel dizzy.  Have swelling in your ankles.  Have trouble with your vision. Get help right away if you:  Develop a severe headache or confusion.  Have unusual weakness or numbness.  Feel faint.  Have severe pain in your chest or abdomen.  Vomit repeatedly.  Have trouble breathing. Summary  Hypertension is when the force of blood pumping through your arteries is too strong. If this condition is not controlled, it may put you at risk for serious complications.  Your personal target blood pressure may vary depending on your medical conditions, your age, and other factors. For most people, a normal blood pressure is less than 120/80.  Hypertension is treated with lifestyle changes, medicines, or a combination of both. Lifestyle changes include losing weight, eating a healthy, low-sodium diet, exercising more, and limiting alcohol. This information is not intended to replace advice given to you by your health care provider. Make sure you discuss any questions you have with your health care provider. Document Revised: 10/08/2017 Document Reviewed: 10/08/2017 Elsevier Patient Education  2020 [employer]      Edwina Barth, MD [facility]

## 2019-04-27 NOTE — Patient Instructions (Addendum)
   If you have lab work done today you will be contacted with your lab results within the next 2 weeks.  If you have not heard from us then please contact us. The fastest way to get your results is to register for My Chart.   IF you received an x-ray today, you will receive an invoice from [facility]. Please contact [facility] at [phone] with questions or concerns regarding your invoice.   IF you received labwork today, you will receive an invoice from [facility]. Please contact [facility] at [phone] with questions or concerns regarding your invoice.   Our billing staff will not be able to assist you with questions regarding bills from these companies.  You will be contacted with the lab results as soon as they are available. The fastest way to get your results is to activate your My Chart account. Instructions are located on the last page of this paperwork. If you have not heard from us regarding the results in 2 weeks, please contact this office.      Hypertension, Adult High blood pressure (hypertension) is when the force of blood pumping through the arteries is too strong. The arteries are the blood vessels that carry blood from the heart throughout the body. Hypertension forces the heart to work harder to pump blood and may cause arteries to become narrow or stiff. Untreated or uncontrolled hypertension can cause a heart attack, heart failure, a stroke, kidney disease, and other problems. A blood pressure reading consists of a higher number over a lower number. Ideally, your blood pressure should be below 120/80. The first ("top") number is called the systolic pressure. It is a measure of the pressure in your arteries as your heart beats. The second ("bottom") number is called the diastolic pressure. It is a measure of the pressure in your arteries as the heart relaxes. What are the causes? The exact cause of this condition is not known. There are some conditions  that result in or are related to high blood pressure. What increases the risk? Some risk factors for high blood pressure are under your control. The following factors may make you more likely to develop this condition:  Smoking.  Having type 2 diabetes mellitus, high cholesterol, or both.  Not getting enough exercise or physical activity.  Being overweight.  Having too much fat, sugar, calories, or salt (sodium) in your diet.  Drinking too much alcohol. Some risk factors for high blood pressure may be difficult or impossible to change. Some of these factors include:  Having chronic kidney disease.  Having a family history of high blood pressure.  Age. Risk increases with age.  Race. You may be at higher risk if you are African American.  Gender. Men are at higher risk than women before age 45. After age 65, women are at higher risk than men.  Having obstructive sleep apnea.  Stress. What are the signs or symptoms? High blood pressure may not cause symptoms. Very high blood pressure (hypertensive crisis) may cause:  Headache.  Anxiety.  Shortness of breath.  Nosebleed.  Nausea and vomiting.  Vision changes.  Severe chest pain.  Seizures. How is this diagnosed? This condition is diagnosed by measuring your blood pressure while you are seated, with your arm resting on a flat surface, your legs uncrossed, and your feet flat on the floor. The cuff of the blood pressure monitor will be placed directly against the skin of your upper arm at the level of your   heart. It should be measured at least twice using the same arm. Certain conditions can cause a difference in blood pressure between your right and left arms. Certain factors can cause blood pressure readings to be lower or higher than normal for a short period of time:  When your blood pressure is higher when you are in a health care provider's office than when you are at home, this is called white coat hypertension.  Most people with this condition do not need medicines.  When your blood pressure is higher at home than when you are in a health care provider's office, this is called masked hypertension. Most people with this condition may need medicines to control blood pressure. If you have a high blood pressure reading during one visit or you have normal blood pressure with other risk factors, you may be asked to:  Return on a different day to have your blood pressure checked again.  Monitor your blood pressure at home for 1 week or longer. If you are diagnosed with hypertension, you may have other blood or imaging tests to help your health care provider understand your overall risk for other conditions. How is this treated? This condition is treated by making healthy lifestyle changes, such as eating healthy foods, exercising more, and reducing your alcohol intake. Your health care provider may prescribe medicine if lifestyle changes are not enough to get your blood pressure under control, and if:  Your systolic blood pressure is above 130.  Your diastolic blood pressure is above 80. Your personal target blood pressure may vary depending on your medical conditions, your age, and other factors. Follow these instructions at home: Eating and drinking   Eat a diet that is high in fiber and potassium, and low in sodium, added sugar, and fat. An example eating plan is called the DASH (Dietary Approaches to Stop Hypertension) diet. To eat this way: ? Eat plenty of fresh fruits and vegetables. Try to fill one half of your plate at each meal with fruits and vegetables. ? Eat whole grains, such as whole-wheat pasta, brown rice, or whole-grain bread. Fill about one fourth of your plate with whole grains. ? Eat or drink low-fat dairy products, such as skim milk or low-fat yogurt. ? Avoid fatty cuts of meat, processed or cured meats, and poultry with skin. Fill about one fourth of your plate with lean proteins, such  as fish, chicken without skin, beans, eggs, or tofu. ? Avoid pre-made and processed foods. These tend to be higher in sodium, added sugar, and fat.  Reduce your daily sodium intake. Most people with hypertension should eat less than 1,500 mg of sodium a day.  Do not drink alcohol if: ? Your health care provider tells you not to drink. ? You are pregnant, may be pregnant, or are planning to become pregnant.  If you drink alcohol: ? Limit how much you use to:  0-1 drink a day for women.  0-2 drinks a day for men. ? Be aware of how much alcohol is in your drink. In the [place] one drink equals one 12 oz bottle of beer (355 mL), one 5 oz glass of wine (148 mL), or one 1 oz glass of hard liquor (44 mL). Lifestyle   Work with your health care provider to maintain a healthy body weight or to lose weight. Ask what an ideal weight is for you.  Get at least 30 minutes of exercise most days of the week. Activities may include walking, swimming,   or biking.  Include exercise to strengthen your muscles (resistance exercise), such as Pilates or lifting weights, as part of your weekly exercise routine. Try to do these types of exercises for 30 minutes at least 3 days a week.  Do not use any products that contain nicotine or tobacco, such as cigarettes, e-cigarettes, and chewing tobacco. If you need help quitting, ask your health care provider.  Monitor your blood pressure at home as told by your health care provider.  Keep all follow-up visits as told by your health care provider. This is important. Medicines  Take over-the-counter and prescription medicines only as told by your health care provider. Follow directions carefully. Blood pressure medicines must be taken as prescribed.  Do not skip doses of blood pressure medicine. Doing this puts you at risk for problems and can make the medicine less effective.  Ask your health care provider about side effects or reactions to medicines that you  should watch for. Contact a health care provider if you:  Think you are having a reaction to a medicine you are taking.  Have headaches that keep coming back (recurring).  Feel dizzy.  Have swelling in your ankles.  Have trouble with your vision. Get help right away if you:  Develop a severe headache or confusion.  Have unusual weakness or numbness.  Feel faint.  Have severe pain in your chest or abdomen.  Vomit repeatedly.  Have trouble breathing. Summary  Hypertension is when the force of blood pumping through your arteries is too strong. If this condition is not controlled, it may put you at risk for serious complications.  Your personal target blood pressure may vary depending on your medical conditions, your age, and other factors. For most people, a normal blood pressure is less than 120/80.  Hypertension is treated with lifestyle changes, medicines, or a combination of both. Lifestyle changes include losing weight, eating a healthy, low-sodium diet, exercising more, and limiting alcohol. This information is not intended to replace advice given to you by your health care provider. Make sure you discuss any questions you have with your health care provider. Document Revised: 10/08/2017 Document Reviewed: 10/08/2017 Elsevier Patient Education  2020 [employer]  

## 2019-06-06 ENCOUNTER — Other Ambulatory Visit: Payer: Self-pay | Admitting: Emergency Medicine

## 2019-06-06 DIAGNOSIS — F4010 Social phobia, unspecified: Secondary | ICD-10-CM

## 2019-06-07 NOTE — Telephone Encounter (Signed)
Patient is requesting a refill of the following medications: Requested Prescriptions   Pending Prescriptions Disp Refills   sertraline (ZOLOFT) 25 MG tablet [Pharmacy Med Name: SERTRALINE HCL 25 MG TABLET] 90 tablet 0    Sig: TAKE 1 TABLET BY MOUTH EVERY DAY    Date of patient request: 06/07/2019 Last office visit: 04/27/2019 Date of last refill: 03/15/2019 Last refill amount: 90 Follow up time period per chart: unspecified

## 2019-08-29 ENCOUNTER — Other Ambulatory Visit: Payer: Self-pay | Admitting: Emergency Medicine

## 2019-08-29 DIAGNOSIS — F4010 Social phobia, unspecified: Secondary | ICD-10-CM

## 2019-08-29 NOTE — Telephone Encounter (Signed)
Requested Prescriptions  Pending Prescriptions Disp Refills   sertraline (ZOLOFT) 25 MG tablet [Pharmacy Med Name: SERTRALINE HCL 25 MG TABLET] 90 tablet 0    Sig: TAKE 1 TABLET BY MOUTH EVERY DAY     Psychiatry:  Antidepressants - SSRI Passed - 08/29/2019  9:16 AM      Passed - Valid encounter within last 6 months    Recent Outpatient Visits          4 months ago Essential hypertension   Primary [facility] at [facility], Eilleen Kempf, MD   10 months ago Essential hypertension   [facility] at [facility], Eilleen Kempf, MD   11 months ago Social anxiety disorder   [facility] at [place], [place], MD   2 years ago Left foot pain   Primary Care at [facility], Eilleen Kempf, MD   3 years ago Acute non-recurrent maxillary sinusitis   [facility] Care at [facility], Eilleen Kempf, MD      Future Appointments            In 2 months Sagardia, Eilleen Kempf, MD Primary Care at [place], [facility]

## 2019-09-15 ENCOUNTER — Emergency Department (HOSPITAL_COMMUNITY): Payer: BC Managed Care – PPO

## 2019-09-15 ENCOUNTER — Encounter (HOSPITAL_COMMUNITY): Payer: Self-pay

## 2019-09-15 ENCOUNTER — Emergency Department (HOSPITAL_COMMUNITY)
Admission: EM | Admit: 2019-09-15 | Discharge: 2019-09-16 | Disposition: A | Discharge status: Home / Self Care | Payer: BC Managed Care – PPO | Attending: Emergency Medicine | Admitting: Emergency Medicine

## 2019-09-15 DIAGNOSIS — Y92007 Garden or yard of unspecified non-institutional (private) residence as the place of occurrence of the external cause: Secondary | ICD-10-CM | POA: Diagnosis not present

## 2019-09-15 DIAGNOSIS — X58XXXA Exposure to other specified factors, initial encounter: Secondary | ICD-10-CM | POA: Insufficient documentation

## 2019-09-15 DIAGNOSIS — L500 Allergic urticaria: Secondary | ICD-10-CM | POA: Diagnosis not present

## 2019-09-15 DIAGNOSIS — Z79899 Other long term (current) drug therapy: Secondary | ICD-10-CM | POA: Insufficient documentation

## 2019-09-15 DIAGNOSIS — Y939 Activity, unspecified: Secondary | ICD-10-CM | POA: Insufficient documentation

## 2019-09-15 DIAGNOSIS — Y999 Unspecified external cause status: Secondary | ICD-10-CM | POA: Insufficient documentation

## 2019-09-15 DIAGNOSIS — I10 Essential (primary) hypertension: Secondary | ICD-10-CM | POA: Insufficient documentation

## 2019-09-15 DIAGNOSIS — R110 Nausea: Secondary | ICD-10-CM | POA: Insufficient documentation

## 2019-09-15 DIAGNOSIS — T63441A Toxic effect of venom of bees, accidental (unintentional), initial encounter: Secondary | ICD-10-CM | POA: Insufficient documentation

## 2019-09-15 DIAGNOSIS — T7840XA Allergy, unspecified, initial encounter: Secondary | ICD-10-CM

## 2019-09-15 MED ORDER — METHYLPREDNISOLONE SODIUM SUCC 125 MG IJ SOLR
125.0000 mg | Freq: Once | INTRAMUSCULAR | Status: AC
  Administered 2019-09-15: 125 mg via INTRAVENOUS
  Filled 2019-09-15: qty 2

## 2019-09-15 MED ORDER — ONDANSETRON HCL 4 MG/2ML IJ SOLN
4.0000 mg | Freq: Once | INTRAMUSCULAR | Status: AC
  Administered 2019-09-15: 4 mg via INTRAVENOUS
  Filled 2019-09-15: qty 2

## 2019-09-15 MED ORDER — FAMOTIDINE IN NACL 20-0.9 MG/50ML-% IV SOLN
20.0000 mg | Freq: Once | INTRAVENOUS | Status: AC
  Administered 2019-09-15: 20 mg via INTRAVENOUS
  Filled 2019-09-15: qty 50

## 2019-09-15 NOTE — ED Provider Notes (Addendum)
[facility] EMERGENCY DEPARTMENT Provider Note   CSN: [id number] Arrival date & time: 09/15/19  2152     History Chief Complaint  Patient presents with  . Allergic Reaction    [name] is a 74 y.o. female.  74 y.o female with a PMH of HTN presents to the ED via EMS s/p yellow jacket bite x 3 hours ago. Patient reports she was working on the yard, when she was stung on the right lower extremity. She then developed a rash to her lower extremities, upper extremities and trunk. She took 50 mg of benadryl with improvement in the itching. She then developed sudden onset of chest pressure, has never experience this in the past along with nausea. She reports episode has improved some. She denies any denies any prior history of anaphalaxis to yellow jacket. No alleviating or exacerbating factors for her chest pain. No prior history of CAD, not a smoker, no other complaints.     The history is provided by the patient and medical records.       Past Medical History:  Diagnosis Date  . Cystocele with prolapse    VAULT PROLAPSE  . History of kidney stones   . Macular degeneration BOTH EYES    Patient Active Problem List   Diagnosis Date Noted  . Essential hypertension 10/28/2018  . Cystocele with prolapse 12/09/2017  . SUI (stress urinary incontinence, female) 07/17/2016  . 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 BREAST ENHANCEMENT SURGERY  2007  . CYSTOCELE REPAIR N/A 12/09/2017   Procedure: ANTERIOR REPAIR (CYSTOCELE);  Surgeon: Alfredo Martinez, MD;  Location: [facility];  Service: Urology;  Laterality: N/A;  . CYSTOSCOPY N/A 12/09/2017   Procedure: CYSTOSCOPY;  Surgeon: Alfredo Martinez, MD;  Location: [facility];  Service: Urology;  Laterality: N/A;  . NECK MASS EXCISION  2012  . TUBAL LIGATION  1984  . VAGINAL HYSTERECTOMY  2007   PARTIAL, AND BLADDER TACH DONE  . VAGINAL PROLAPSE REPAIR N/A 12/09/2017    Procedure: VAGINAL VAULT PROLAPSE REPAIR WITH GRAFT;  Surgeon: Alfredo Martinez, MD;  Location: [facility];  Service: Urology;  Laterality: N/A;     OB History   No obstetric history on file.     Family History  Problem Relation Age of Onset  . Stroke Father   . Colon cancer Neg Hx     Social History   Tobacco Use  . Smoking status: Never Smoker  . Smokeless tobacco: Never Used  Vaping Use  . Vaping Use: Never used  Substance Use Topics  . Alcohol use: No  . Drug use: No    Home Medications Prior to Admission medications   Medication Sig Start Date End Date Taking? Authorizing Provider  losartan-hydrochlorothiazide (HYZAAR) 50-12.5 MG tablet Take 1 tablet by mouth daily. 10/28/18 09/14/28 Yes Sagardia, Eilleen Kempf, MD  sertraline (ZOLOFT) 25 MG tablet TAKE 1 TABLET BY MOUTH EVERY DAY Patient taking differently: Take 25 mg by mouth daily.  08/29/19  Yes Sagardia, Eilleen Kempf, MD  predniSONE (DELTASONE) 20 MG tablet Take 2 tablets (40 mg total) by mouth daily for 5 days. 09/16/19 09/21/19  Claude Manges, PA-C    Allergies    Yellow jacket venom [bee venom]  Review of Systems   Review of Systems  Constitutional: Negative for chills and fever.  HENT: Negative for sinus pressure.   Respiratory: Negative for shortness of breath.   Cardiovascular: Positive for chest pain.  Negative for palpitations.  Gastrointestinal: Positive for nausea. Negative for abdominal pain, diarrhea and vomiting.  Genitourinary: Negative for flank pain.  Musculoskeletal: Negative for back pain.  Skin: Negative for pallor and wound.  Neurological: Negative for light-headedness.  All other systems reviewed and are negative.   Physical Exam Updated Vital Signs BP 123/75 (BP Location: Right Arm)   Pulse 89   Temp 97.6 F (36.4 C) (Oral)   Resp (!) 21   Ht 5' 4.5" (1.638 m)   Wt 69 kg   SpO2 99%   BMI 25.71 kg/m   Physical Exam Vitals and nursing note reviewed.  Constitutional:      Appearance:  Normal appearance.  HENT:     Head: Normocephalic and atraumatic.     Mouth/Throat:     Mouth: Mucous membranes are moist.  Eyes:     Pupils: Pupils are equal, round, and reactive to light.  Cardiovascular:     Rate and Rhythm: Normal rate.  Pulmonary:     Effort: Pulmonary effort is normal.     Breath sounds: No wheezing or rales.  Abdominal:     General: Abdomen is flat.     Tenderness: There is no abdominal tenderness. There is no right CVA tenderness or left CVA tenderness.  Musculoskeletal:     Cervical back: Normal range of motion and neck supple.  Skin:    General: Skin is warm and dry.     Findings: Erythema and rash present. No bruising or petechiae. Rash is urticarial.          Comments: Urticarial rash throughout all upper, lower extremities and trunk.   Neurological:     Mental Status: She is alert and oriented to person, place, and time.     ED Results / Procedures / Treatments   Labs (all labs ordered are listed, but only abnormal results are displayed) Labs Reviewed  BASIC METABOLIC PANEL - Abnormal; Notable for the following components:      Result Value   Potassium 3.4 (*)    Glucose, Bld 123 (*)    All other components within normal limits  CBC  TROPONIN I (HIGH SENSITIVITY)  TROPONIN I (HIGH SENSITIVITY)    EKG EKG Interpretation  Date/Time:  Wednesday September 15 2019 21:58:48 EDT Ventricular Rate:  73 PR Interval:  144 QRS Duration: 80 QT Interval:  380 QTC Calculation: 418 R Axis:   53 Text Interpretation: Normal sinus rhythm Normal ECG Confirmed by Kennis Carina [phone]) on 09/15/2019 10:54:52 PM   Radiology DG Chest Port 1 View  Result Date: 09/15/2019 CLINICAL DATA:  Shortness of breath EXAM: PORTABLE CHEST 1 VIEW COMPARISON:  09/27/2018 FINDINGS: No focal opacity or pleural effusion. Normal heart size. Aortic atherosclerosis. No pneumothorax. IMPRESSION: No active disease. Electronically Signed   By: Jasmine Pang M.D.   On: 09/15/2019  22:43    Procedures Procedures (including critical care time)  Medications Ordered in ED Medications  ondansetron (ZOFRAN) injection 4 mg (4 mg Intravenous Given 09/15/19 2243)  methylPREDNISolone sodium succinate (SOLU-MEDROL) 125 mg/2 mL injection 125 mg (125 mg Intravenous Given 09/15/19 2243)  famotidine (PEPCID) IVPB 20 mg premix (0 mg Intravenous Stopped 09/15/19 2319)    ED Course  I have reviewed the triage vital signs and the nursing notes.  Pertinent labs & imaging results that were available during my care of the patient were reviewed by me and considered in my medical decision making (see chart for details).    MDM Rules/Calculators/A&P  Patient  with a PMH of HTN presents to the ED s/p yellow jacket sting prior to arrival breaking out into a rash. Endorses chest pain along with nausea, taken benadryl 50 mg PO with improvement in symptoms of rash but not with the chest pain.  No prior history of anaphylaxis to bees or yellow jackets.   During evaluation patient is overall well appearing, no signs of respiratory distress, she is able to speak in full sentences, without any increase work of breahting. Vitals are within normal limits, with a oxygen saturation of 100% on RA. Rash noted to BL upper extremities, lower extremities, trunk and back. Oropharynx is clear, no signs of mucosal involvement. Pepcid, solumedrol order to help with patient's symptomatic control.   EKG obtained due to chest pain complaint, NSR without any ST changes. Chest xray without any signs of pulmonary involvement. CBC interpret by me is unremarkarble.   1:06 AM I have personally called lab in order to obtain results, specimen continues to be in process.   1:42 AM interpretation of her labs revealed a BMP with some mild hypokalemia, glucose is slightly elevated patient has received steroids.  Creatinine function is within normal limits.  CBC is unremarkable.  First troponin is negative.  These results were  discussed with patient at length.  I discussed treatment with steroids at home along with an Epi Pen, she is agreeable to plan at this time.  Vitals have remained stable, no signs of respiratory compromise, no GI complaints.  Patient is stable for discharge at this time.   Portions of this note were generated with Scientist, clinical (histocompatibility and immunogenetics). Dictation errors may occur despite best attempts at proofreading.  Final Clinical Impression(s) / ED Diagnoses Final diagnoses:  Allergic reaction, initial encounter    Rx / DC Orders ED Discharge Orders         Ordered    predniSONE (DELTASONE) 20 MG tablet  Daily     Discontinue  Reprint     09/16/19 0110           Claude Manges, PA-C 09/16/19 0146    Claude Manges, PA-C 09/16/19 0153    Sabas Sous, MD 09/18/19 1106

## 2019-09-15 NOTE — ED Triage Notes (Signed)
Pt BIB [facility] for eval of allergic reaction after being stuck by 2 bees in yard. No hx of allergy to bees. Pt noted diffuse hives, still present on arrival and chest pain after the hives which prompted her to call 911. Pt took 50mg  PO Benadryl at home which has improved some of the itching/hives. Still noted w/ redness all over. Denies scratchy throat, SOB, nausea. Endorses ongoing CP

## 2019-09-16 LAB — BASIC METABOLIC PANEL
Anion gap: 9 (ref 5–15)
BUN: 20 mg/dL (ref 8–23)
CO2: 23 mmol/L (ref 22–32)
Calcium: 9.4 mg/dL (ref 8.9–10.3)
Chloride: 105 mmol/L (ref 98–111)
Creatinine, Ser: 0.93 mg/dL (ref 0.44–1.00)
GFR calc Af Amer: >60 mL/min (ref >60)
GFR calc non Af Amer: >60 mL/min (ref >60)
Glucose, Bld: 123 mg/dL — ABNORMAL HIGH (ref 70–99)
Potassium: 3.4 mmol/L — ABNORMAL LOW (ref 3.5–5.1)
Sodium: 137 mmol/L (ref 135–145)

## 2019-09-16 LAB — CBC
HCT: 39.4 % (ref 36.0–46.0)
Hemoglobin: 13 g/dL (ref 12.0–15.0)
MCH: 31.3 pg (ref 26.0–34.0)
MCHC: 33 g/dL (ref 30.0–36.0)
MCV: 94.9 fL (ref 80.0–100.0)
Platelets: 285 10*3/uL (ref 150–400)
RBC: 4.15 MIL/uL (ref 3.87–5.11)
RDW: 12 % (ref 11.5–15.5)
WBC: 5.5 10*3/uL (ref 4.0–10.5)
nRBC: 0 % (ref 0.0–0.2)

## 2019-09-16 LAB — TROPONIN I (HIGH SENSITIVITY): Troponin I (High Sensitivity): 3 ng/L (ref <18)

## 2019-09-16 MED ORDER — PREDNISONE 20 MG PO TABS: 40.0000 mg | ORAL_TABLET | Freq: Every day | ORAL | 0 refills | Status: AC

## 2019-09-16 MED ORDER — EPINEPHRINE 0.3 MG/0.3ML IJ SOAJ: 0.3000 mg | INTRAMUSCULAR | 0 refills | Status: AC | PRN

## 2019-09-16 NOTE — Discharge Instructions (Addendum)
Your laboratory results were within normal limits. I have prescribed steroids to help stop the allergic reaction. Please take 2 tablet daily for the next 5 days.   You were also provided with a prescription for an EpiPen, please use this if you develop any shortness of breath, difficulty breathing after a following attack by yellow jackets.  Please be aware steroids can cause flushness, insomnia and appetite changes.

## 2019-09-16 NOTE — ED Notes (Signed)
Patient verbalizes understanding of discharge instructions. Opportunity for questioning and answers were provided. Armband removed by staff, pt discharged from ED stable & ambulatory  

## 2019-10-10 ENCOUNTER — Other Ambulatory Visit: Payer: Self-pay | Admitting: Emergency Medicine

## 2019-10-10 DIAGNOSIS — I10 Essential (primary) hypertension: Secondary | ICD-10-CM

## 2019-11-02 ENCOUNTER — Ambulatory Visit (INDEPENDENT_AMBULATORY_CARE_PROVIDER_SITE_OTHER): Payer: BC Managed Care – PPO | Admitting: Emergency Medicine

## 2019-11-02 ENCOUNTER — Encounter: Payer: Self-pay | Admitting: Emergency Medicine

## 2019-11-02 ENCOUNTER — Other Ambulatory Visit: Payer: Self-pay

## 2019-11-02 VITALS — BP 125/78 | HR 69 | Temp 98.7°F | Resp 16 | Ht 64.5 in | Wt 149.0 lb

## 2019-11-02 DIAGNOSIS — I10 Essential (primary) hypertension: Secondary | ICD-10-CM | POA: Diagnosis not present

## 2019-11-02 DIAGNOSIS — F4010 Social phobia, unspecified: Secondary | ICD-10-CM | POA: Diagnosis not present

## 2019-11-02 MED ORDER — LOSARTAN POTASSIUM-HCTZ 50-12.5 MG PO TABS: 1.0000 | ORAL_TABLET | Freq: Every day | ORAL | 3 refills | Status: DC

## 2019-11-02 MED ORDER — SERTRALINE HCL 25 MG PO TABS: 25.0000 mg | ORAL_TABLET | Freq: Every day | ORAL | 3 refills | Status: DC

## 2019-11-02 NOTE — Patient Instructions (Addendum)
   If you have lab work done today you will be contacted with your lab results within the next 2 weeks.  If you have not heard from us then please contact us. The fastest way to get your results is to register for My Chart.   IF you received an x-ray today, you will receive an invoice from [facility]. Please contact [facility] at [phone] with questions or concerns regarding your invoice.   IF you received labwork today, you will receive an invoice from [facility]. Please contact [facility] at [phone] with questions or concerns regarding your invoice.   Our billing staff will not be able to assist you with questions regarding bills from these companies.  You will be contacted with the lab results as soon as they are available. The fastest way to get your results is to activate your My Chart account. Instructions are located on the last page of this paperwork. If you have not heard from us regarding the results in 2 weeks, please contact this office.      Hypertension, Adult High blood pressure (hypertension) is when the force of blood pumping through the arteries is too strong. The arteries are the blood vessels that carry blood from the heart throughout the body. Hypertension forces the heart to work harder to pump blood and may cause arteries to become narrow or stiff. Untreated or uncontrolled hypertension can cause a heart attack, heart failure, a stroke, kidney disease, and other problems. A blood pressure reading consists of a higher number over a lower number. Ideally, your blood pressure should be below 120/80. The first ("top") number is called the systolic pressure. It is a measure of the pressure in your arteries as your heart beats. The second ("bottom") number is called the diastolic pressure. It is a measure of the pressure in your arteries as the heart relaxes. What are the causes? The exact cause of this condition is not known. There are some conditions  that result in or are related to high blood pressure. What increases the risk? Some risk factors for high blood pressure are under your control. The following factors may make you more likely to develop this condition:  Smoking.  Having type 2 diabetes mellitus, high cholesterol, or both.  Not getting enough exercise or physical activity.  Being overweight.  Having too much fat, sugar, calories, or salt (sodium) in your diet.  Drinking too much alcohol. Some risk factors for high blood pressure may be difficult or impossible to change. Some of these factors include:  Having chronic kidney disease.  Having a family history of high blood pressure.  Age. Risk increases with age.  Race. You may be at higher risk if you are African American.  Gender. Men are at higher risk than women before age 45. After age 65, women are at higher risk than men.  Having obstructive sleep apnea.  Stress. What are the signs or symptoms? High blood pressure may not cause symptoms. Very high blood pressure (hypertensive crisis) may cause:  Headache.  Anxiety.  Shortness of breath.  Nosebleed.  Nausea and vomiting.  Vision changes.  Severe chest pain.  Seizures. How is this diagnosed? This condition is diagnosed by measuring your blood pressure while you are seated, with your arm resting on a flat surface, your legs uncrossed, and your feet flat on the floor. The cuff of the blood pressure monitor will be placed directly against the skin of your upper arm at the level of your   heart. It should be measured at least twice using the same arm. Certain conditions can cause a difference in blood pressure between your right and left arms. Certain factors can cause blood pressure readings to be lower or higher than normal for a short period of time:  When your blood pressure is higher when you are in a health care provider's office than when you are at home, this is called white coat hypertension.  Most people with this condition do not need medicines.  When your blood pressure is higher at home than when you are in a health care provider's office, this is called masked hypertension. Most people with this condition may need medicines to control blood pressure. If you have a high blood pressure reading during one visit or you have normal blood pressure with other risk factors, you may be asked to:  Return on a different day to have your blood pressure checked again.  Monitor your blood pressure at home for 1 week or longer. If you are diagnosed with hypertension, you may have other blood or imaging tests to help your health care provider understand your overall risk for other conditions. How is this treated? This condition is treated by making healthy lifestyle changes, such as eating healthy foods, exercising more, and reducing your alcohol intake. Your health care provider may prescribe medicine if lifestyle changes are not enough to get your blood pressure under control, and if:  Your systolic blood pressure is above 130.  Your diastolic blood pressure is above 80. Your personal target blood pressure may vary depending on your medical conditions, your age, and other factors. Follow these instructions at home: Eating and drinking   Eat a diet that is high in fiber and potassium, and low in sodium, added sugar, and fat. An example eating plan is called the DASH (Dietary Approaches to Stop Hypertension) diet. To eat this way: ? Eat plenty of fresh fruits and vegetables. Try to fill one half of your plate at each meal with fruits and vegetables. ? Eat whole grains, such as whole-wheat pasta, brown rice, or whole-grain bread. Fill about one fourth of your plate with whole grains. ? Eat or drink low-fat dairy products, such as skim milk or low-fat yogurt. ? Avoid fatty cuts of meat, processed or cured meats, and poultry with skin. Fill about one fourth of your plate with lean proteins, such  as fish, chicken without skin, beans, eggs, or tofu. ? Avoid pre-made and processed foods. These tend to be higher in sodium, added sugar, and fat.  Reduce your daily sodium intake. Most people with hypertension should eat less than 1,500 mg of sodium a day.  Do not drink alcohol if: ? Your health care provider tells you not to drink. ? You are pregnant, may be pregnant, or are planning to become pregnant.  If you drink alcohol: ? Limit how much you use to:  0-1 drink a day for women.  0-2 drinks a day for men. ? Be aware of how much alcohol is in your drink. In the [place] one drink equals one 12 oz bottle of beer (355 mL), one 5 oz glass of wine (148 mL), or one 1 oz glass of hard liquor (44 mL). Lifestyle   Work with your health care provider to maintain a healthy body weight or to lose weight. Ask what an ideal weight is for you.  Get at least 30 minutes of exercise most days of the week. Activities may include walking, swimming,   or biking.  Include exercise to strengthen your muscles (resistance exercise), such as Pilates or lifting weights, as part of your weekly exercise routine. Try to do these types of exercises for 30 minutes at least 3 days a week.  Do not use any products that contain nicotine or tobacco, such as cigarettes, e-cigarettes, and chewing tobacco. If you need help quitting, ask your health care provider.  Monitor your blood pressure at home as told by your health care provider.  Keep all follow-up visits as told by your health care provider. This is important. Medicines  Take over-the-counter and prescription medicines only as told by your health care provider. Follow directions carefully. Blood pressure medicines must be taken as prescribed.  Do not skip doses of blood pressure medicine. Doing this puts you at risk for problems and can make the medicine less effective.  Ask your health care provider about side effects or reactions to medicines that you  should watch for. Contact a health care provider if you:  Think you are having a reaction to a medicine you are taking.  Have headaches that keep coming back (recurring).  Feel dizzy.  Have swelling in your ankles.  Have trouble with your vision. Get help right away if you:  Develop a severe headache or confusion.  Have unusual weakness or numbness.  Feel faint.  Have severe pain in your chest or abdomen.  Vomit repeatedly.  Have trouble breathing. Summary  Hypertension is when the force of blood pumping through your arteries is too strong. If this condition is not controlled, it may put you at risk for serious complications.  Your personal target blood pressure may vary depending on your medical conditions, your age, and other factors. For most people, a normal blood pressure is less than 120/80.  Hypertension is treated with lifestyle changes, medicines, or a combination of both. Lifestyle changes include losing weight, eating a healthy, low-sodium diet, exercising more, and limiting alcohol. This information is not intended to replace advice given to you by your health care provider. Make sure you discuss any questions you have with your health care provider. Document Revised: 10/08/2017 Document Reviewed: 10/08/2017 [employer] Patient Education  2020 [employer]  

## 2019-11-02 NOTE — Progress Notes (Signed)
[name] 74 y.o.   Chief Complaint  Patient presents with  . Hypertension    follow up 6 months  . Medication Refill    Losartan-Hctz and Sertraline    HISTORY OF PRESENT ILLNESS: This is a 74 y.o. female with history of hypertension here for follow-up and medication refill. Presently taking losartan-hydrochlorothiazide with normal blood pressure readings at home. Also requesting refill on sertraline 25 mg which she has been taking for a long time. Not vaccinated against Covid but she had Covid infection last January so has natural immunity. BP Readings from Last 3 Encounters:  11/02/19 125/78  09/16/19 124/72  04/27/19 120/74    HPI   Prior to Admission medications   Medication Sig Start Date End Date Taking? Authorizing Provider  EPINEPHrine 0.3 mg/0.3 mL IJ SOAJ injection Inject 0.3 mLs (0.3 mg total) into the muscle as needed for up to 1 dose for anaphylaxis. 09/16/19  Yes Soto, Leonie Douglas, PA-C  losartan-hydrochlorothiazide (HYZAAR) 50-12.5 MG tablet TAKE 1 TABLET BY MOUTH EVERY DAY 10/10/19  Yes Hobert Poplaski, Eilleen Kempf, MD  sertraline (ZOLOFT) 25 MG tablet TAKE 1 TABLET BY MOUTH EVERY DAY Patient taking differently: Take 25 mg by mouth daily.  08/29/19  Yes Georgina Quint, MD    Allergies  Allergen Reactions  . Yellow Jacket Venom [Bee Venom] Rash    Patient Active Problem List   Diagnosis Date Noted  . Essential hypertension 10/28/2018  . Cystocele with prolapse 12/09/2017  . SUI (stress urinary incontinence, female) 07/17/2016  . 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 Cystocele with prolapse    VAULT PROLAPSE  . History of kidney stones   . Macular degeneration BOTH EYES    Past Surgical History:  Procedure Laterality Date  . BREAST ENHANCEMENT SURGERY  2007  . CYSTOCELE REPAIR N/A 12/09/2017   Procedure: ANTERIOR REPAIR (CYSTOCELE);  Surgeon: Alfredo Martinez, MD;  Location: [facility];  Service: Urology;   Laterality: N/A;  . CYSTOSCOPY N/A 12/09/2017   Procedure: CYSTOSCOPY;  Surgeon: Alfredo Martinez, MD;  Location: [facility];  Service: Urology;  Laterality: N/A;  . NECK MASS EXCISION  2012  . TUBAL LIGATION  1984  . VAGINAL HYSTERECTOMY  2007   PARTIAL, AND BLADDER TACH DONE  . VAGINAL PROLAPSE REPAIR N/A 12/09/2017   Procedure: VAGINAL VAULT PROLAPSE REPAIR WITH GRAFT;  Surgeon: Alfredo Martinez, MD;  Location: [facility];  Service: Urology;  Laterality: N/A;    Social History   Socioeconomic History  . Marital status: Married    Spouse name: Not on file  . Number of children: Not on file  . Years of education: Not on file  . Highest education level: Not on file  Occupational History  . Not on file  Tobacco Use  . Smoking status: Never Smoker  . Smokeless tobacco: Never Used  Vaping Use  . Vaping Use: Never used  Substance and Sexual Activity  . Alcohol use: No  . Drug use: No  . Sexual activity: Not on file  Other Topics Concern  . Not on file  Social History Narrative  . Not on file   Social Determinants of Health   Financial Resource Strain:   . Difficulty of Paying Living Expenses: Not on file  Food Insecurity:   . Worried About Programme researcher, broadcasting/film/video in the Last Year: Not on file  . Ran Out of Food in the Last Year: Not on file  Transportation Needs:   .  Lack of Transportation (Medical): Not on file  . Lack of Transportation (Non-Medical): Not on file  Physical Activity:   . Days of Exercise per Week: Not on file  . Minutes of Exercise per Session: Not on file  Stress:   . Feeling of Stress : Not on file  Social Connections:   . Frequency of Communication with Friends and Family: Not on file  . Frequency of Social Gatherings with Friends and Family: Not on file  . Attends Religious Services: Not on file  . Active Member of Clubs or Organizations: Not on file  . Attends BankerClub or Organization Meetings: Not on file  . Marital Status: Not on file  Intimate Partner  Violence:   . Fear of Current or Ex-Partner: Not on file  . Emotionally Abused: Not on file  . Physically Abused: Not on file  . Sexually Abused: Not on file    Family History  Problem Relation Age of Onset  . Stroke Father   . Colon cancer Neg Hx      Review of Systems  Constitutional: Negative.  Negative for chills and fever.  HENT: Negative.  Negative for congestion and sore throat.   Respiratory: Negative.  Negative for cough and shortness of breath.   Cardiovascular: Negative.  Negative for chest pain and palpitations.  Gastrointestinal: Negative.  Negative for abdominal pain, diarrhea, nausea and vomiting.  Genitourinary: Negative.  Negative for dysuria and hematuria.  Musculoskeletal: Negative.  Negative for back pain, myalgias and neck pain.  Skin: Negative.  Negative for rash.  Neurological: Negative.  Negative for headaches.  All other systems reviewed and are negative.  Today's Vitals   11/02/19 1038  BP: 125/78  Pulse: 69  Resp: 16  Temp: 98.7 F (37.1 C)  TempSrc: Temporal  SpO2: 96%  Weight: 149 lb (67.6 kg)  Height: 5' 4.5" (1.638 m)   Body mass index is 25.18 kg/m.   Physical Exam Vitals reviewed.  Constitutional:      Appearance: Normal appearance.  HENT:     Head: Normocephalic.  Eyes:     Extraocular Movements: Extraocular movements intact.  Cardiovascular:     Rate and Rhythm: Normal rate.  Pulmonary:     Effort: Pulmonary effort is normal.  Musculoskeletal:        General: Normal range of motion.     Cervical back: Normal range of motion.  Skin:    General: Skin is warm and dry.  Neurological:     General: No focal deficit present.     Mental Status: She is alert and oriented to person, place, and time.  Psychiatric:        Mood and Affect: Mood normal.        Behavior: Behavior normal.      ASSESSMENT & PLAN: Clinically stable.  No medical concerns identified during this visit.  Continue present medications.  No changes.   Follow-up in 6 months. [name] was seen today for hypertension and medication refill.  Diagnoses and all orders for this visit:  Essential hypertension -     losartan-hydrochlorothiazide (HYZAAR) 50-12.5 MG tablet; Take 1 tablet by mouth daily.  Social anxiety disorder -     sertraline (ZOLOFT) 25 MG tablet; Take 1 tablet (25 mg total) by mouth daily.    Patient Instructions       If you have lab work done today you will be contacted with your lab results within the next 2 weeks.  If you have not heard from  [place] then please contact us. The fastest way to get your results is to register for My Chart.   IF you received an x-ray today, you will receive an invoice from [facility]. Please contact [facility] at [phone] with questions or concerns regarding your invoice.   IF you received labwork today, you will receive an invoice from [facility]. Please contact [facility] at [phone] with questions or concerns regarding your invoice.   Our billing staff will not be able to assist you with questions regarding bills from these companies.  You will be contacted with the lab results as soon as they are available. The fastest way to get your results is to activate your My Chart account. Instructions are located on the last page of this paperwork. If you have not heard from [place] regarding the results in 2 weeks, please contact this office.     Hypertension, Adult High blood pressure (hypertension) is when the force of blood pumping through the arteries is too strong. The arteries are the blood vessels that carry blood from the heart throughout the body. Hypertension forces the heart to work harder to pump blood and may cause arteries to become narrow or stiff. Untreated or uncontrolled hypertension can cause a heart attack, heart failure, a stroke, kidney disease, and other problems. A blood pressure reading consists of a higher number over a lower number. Ideally, your blood  pressure should be below 120/80. The first ("top") number is called the systolic pressure. It is a measure of the pressure in your arteries as your heart beats. The second ("bottom") number is called the diastolic pressure. It is a measure of the pressure in your arteries as the heart relaxes. What are the causes? The exact cause of this condition is not known. There are some conditions that result in or are related to high blood pressure. What increases the risk? Some risk factors for high blood pressure are under your control. The following factors may make you more likely to develop this condition:  Smoking.  Having type 2 diabetes mellitus, high cholesterol, or both.  Not getting enough exercise or physical activity.  Being overweight.  Having too much fat, sugar, calories, or salt (sodium) in your diet.  Drinking too much alcohol. Some risk factors for high blood pressure may be difficult or impossible to change. Some of these factors include:  Having chronic kidney disease.  Having a family history of high blood pressure.  Age. Risk increases with age.  Race. You may be at higher risk if you are African American.  Gender. Men are at higher risk than women before age 28. After age 65, women are at higher risk than men.  Having obstructive sleep apnea.  Stress. What are the signs or symptoms? High blood pressure may not cause symptoms. Very high blood pressure (hypertensive crisis) may cause:  Headache.  Anxiety.  Shortness of breath.  Nosebleed.  Nausea and vomiting.  Vision changes.  Severe chest pain.  Seizures. How is this diagnosed? This condition is diagnosed by measuring your blood pressure while you are seated, with your arm resting on a flat surface, your legs uncrossed, and your feet flat on the floor. The cuff of the blood pressure monitor will be placed directly against the skin of your upper arm at the level of your heart. It should be measured at  least twice using the same arm. Certain conditions can cause a difference in blood pressure between your right and left arms. Certain factors can  cause blood pressure readings to be lower or higher than normal for a short period of time:  When your blood pressure is higher when you are in a health care provider's office than when you are at home, this is called white coat hypertension. Most people with this condition do not need medicines.  When your blood pressure is higher at home than when you are in a health care provider's office, this is called masked hypertension. Most people with this condition may need medicines to control blood pressure. If you have a high blood pressure reading during one visit or you have normal blood pressure with other risk factors, you may be asked to:  Return on a different day to have your blood pressure checked again.  Monitor your blood pressure at home for 1 week or longer. If you are diagnosed with hypertension, you may have other blood or imaging tests to help your health care provider understand your overall risk for other conditions. How is this treated? This condition is treated by making healthy lifestyle changes, such as eating healthy foods, exercising more, and reducing your alcohol intake. Your health care provider may prescribe medicine if lifestyle changes are not enough to get your blood pressure under control, and if:  Your systolic blood pressure is above 130.  Your diastolic blood pressure is above 80. Your personal target blood pressure may vary depending on your medical conditions, your age, and other factors. Follow these instructions at home: Eating and drinking   Eat a diet that is high in fiber and potassium, and low in sodium, added sugar, and fat. An example eating plan is called the DASH (Dietary Approaches to Stop Hypertension) diet. To eat this way: ? Eat plenty of fresh fruits and vegetables. Try to fill one half of your plate  at each meal with fruits and vegetables. ? Eat whole grains, such as whole-wheat pasta, brown rice, or whole-grain bread. Fill about one fourth of your plate with whole grains. ? Eat or drink low-fat dairy products, such as skim milk or low-fat yogurt. ? Avoid fatty cuts of meat, processed or cured meats, and poultry with skin. Fill about one fourth of your plate with lean proteins, such as fish, chicken without skin, beans, eggs, or tofu. ? Avoid pre-made and processed foods. These tend to be higher in sodium, added sugar, and fat.  Reduce your daily sodium intake. Most people with hypertension should eat less than 1,500 mg of sodium a day.  Do not drink alcohol if: ? Your health care provider tells you not to drink. ? You are pregnant, may be pregnant, or are planning to become pregnant.  If you drink alcohol: ? Limit how much you use to:  0-1 drink a day for women.  0-2 drinks a day for men. ? Be aware of how much alcohol is in your drink. In the [place] one drink equals one 12 oz bottle of beer (355 mL), one 5 oz glass of wine (148 mL), or one 1 oz glass of hard liquor (44 mL). Lifestyle   Work with your health care provider to maintain a healthy body weight or to lose weight. Ask what an ideal weight is for you.  Get at least 30 minutes of exercise most days of the week. Activities may include walking, swimming, or biking.  Include exercise to strengthen your muscles (resistance exercise), such as Pilates or lifting weights, as part of your weekly exercise routine. Try to do these types of exercises  for 30 minutes at least 3 days a week.  Do not use any products that contain nicotine or tobacco, such as cigarettes, e-cigarettes, and chewing tobacco. If you need help quitting, ask your health care provider.  Monitor your blood pressure at home as told by your health care provider.  Keep all follow-up visits as told by your health care provider. This is important. Medicines  Take  over-the-counter and prescription medicines only as told by your health care provider. Follow directions carefully. Blood pressure medicines must be taken as prescribed.  Do not skip doses of blood pressure medicine. Doing this puts you at risk for problems and can make the medicine less effective.  Ask your health care provider about side effects or reactions to medicines that you should watch for. Contact a health care provider if you:  Think you are having a reaction to a medicine you are taking.  Have headaches that keep coming back (recurring).  Feel dizzy.  Have swelling in your ankles.  Have trouble with your vision. Get help right away if you:  Develop a severe headache or confusion.  Have unusual weakness or numbness.  Feel faint.  Have severe pain in your chest or abdomen.  Vomit repeatedly.  Have trouble breathing. Summary  Hypertension is when the force of blood pumping through your arteries is too strong. If this condition is not controlled, it may put you at risk for serious complications.  Your personal target blood pressure may vary depending on your medical conditions, your age, and other factors. For most people, a normal blood pressure is less than 120/80.  Hypertension is treated with lifestyle changes, medicines, or a combination of both. Lifestyle changes include losing weight, eating a healthy, low-sodium diet, exercising more, and limiting alcohol. This information is not intended to replace advice given to you by your health care provider. Make sure you discuss any questions you have with your health care provider. Document Revised: 10/08/2017 Document Reviewed: 10/08/2017 Elsevier Patient Education  2020 Elsevier Inc.      Edwina Barth, MD [facility]

## 2019-11-19 ENCOUNTER — Ambulatory Visit: Payer: Medicare Other

## 2019-11-19 ENCOUNTER — Other Ambulatory Visit: Payer: Self-pay

## 2019-11-19 ENCOUNTER — Telehealth: Payer: Self-pay | Admitting: Emergency Medicine

## 2019-11-19 ENCOUNTER — Telehealth (INDEPENDENT_AMBULATORY_CARE_PROVIDER_SITE_OTHER): Payer: BC Managed Care – PPO | Admitting: Family Medicine

## 2019-11-19 ENCOUNTER — Encounter: Payer: Self-pay | Admitting: Family Medicine

## 2019-11-19 DIAGNOSIS — Z20822 Contact with and (suspected) exposure to covid-19: Secondary | ICD-10-CM

## 2019-11-19 NOTE — Patient Instructions (Signed)
° ° ° °  If you have lab work done today you will be contacted with your lab results within the next 2 weeks.  If you have not heard from us then please contact us. The fastest way to get your results is to register for My Chart. ° ° °IF you received an x-ray today, you will receive an invoice from [facility]. Please contact [facility] at [phone] with questions or concerns regarding your invoice.  ° °IF you received labwork today, you will receive an invoice from [facility]. Please contact [facility] at [phone] with questions or concerns regarding your invoice.  ° °Our billing staff will not be able to assist you with questions regarding bills from these companies. ° °You will be contacted with the lab results as soon as they are available. The fastest way to get your results is to activate your My Chart account. Instructions are located on the last page of this paperwork. If you have not heard from us regarding the results in 2 weeks, please contact this office. °  ° ° ° °

## 2019-11-19 NOTE — Telephone Encounter (Signed)
Noted  

## 2019-11-19 NOTE — Telephone Encounter (Signed)
Patient is calling back to let Dr. Alvy Bimler know that her COVID test was negative.

## 2019-11-19 NOTE — Progress Notes (Signed)
Virtual Visit Note  I connected with patient on 11/19/19 at 540pm by phone and verified that I am speaking with the correct person using two identifiers. [name] is currently located at home and patient is currently with them during visit. The provider, Myles Lipps, MD is located in their office at time of visit.  I discussed the limitations, risks, security and privacy concerns of performing an evaluation and management service by telephone and the availability of in person appointments. I also discussed with the patient that there may be a patient responsible charge related to this service. The patient expressed understanding and agreed to proceed.   I provided 8 minutes of non-face-to-face time during this encounter.  Chief Complaint  Patient presents with  . 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 Now having sx of runny nose and cough. Going today to have rapid done.     HPI ? 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 Yellow Jacket Venom [Bee Venom] Rash    Prior to Admission medications   Medication Sig Start Date End Date Taking? Authorizing Provider  EPINEPHrine 0.3 mg/0.3 mL IJ SOAJ injection Inject 0.3 mLs (0.3 mg total) into the muscle as needed for up to 1 dose for anaphylaxis. 09/16/19   Claude Manges, PA-C    losartan-hydrochlorothiazide (HYZAAR) 50-12.5 MG tablet Take 1 tablet by mouth daily. 11/02/19   Georgina Quint, MD  sertraline (ZOLOFT) 25 MG tablet Take 1 tablet (25 mg total) by mouth daily. 11/02/19   Georgina Quint, MD    Past Medical History:  Diagnosis Date  . Cystocele with prolapse    VAULT PROLAPSE  . History of kidney stones   . Macular degeneration BOTH EYES    Past Surgical History:  Procedure Laterality Date  . BREAST ENHANCEMENT SURGERY  2007  . CYSTOCELE REPAIR N/A 12/09/2017   Procedure: ANTERIOR REPAIR (CYSTOCELE);  Surgeon: Alfredo Martinez, MD;  Location: [facility];  Service: Urology;  Laterality: N/A;  . CYSTOSCOPY N/A 12/09/2017   Procedure: CYSTOSCOPY;  Surgeon: Alfredo Martinez, MD;  Location: [facility];  Service: Urology;  Laterality: N/A;  . NECK MASS EXCISION  2012  . TUBAL LIGATION  1984  . VAGINAL HYSTERECTOMY  2007   PARTIAL, AND BLADDER TACH DONE  . VAGINAL PROLAPSE REPAIR N/A 12/09/2017   Procedure: VAGINAL VAULT PROLAPSE REPAIR WITH GRAFT;  Surgeon: Alfredo Martinez, MD;  Location: [facility];  Service: Urology;  Laterality: N/A;    Social History   Tobacco Use  . Smoking status: Never Smoker  . Smokeless tobacco: Never Used  Substance Use Topics  . Alcohol use: No    Family History  Problem Relation Age of Onset  . Stroke Father   . Colon cancer  Neg Hx     ROS Per hpi  Objective  Vitals as reported by the patient:  Gen AAOx3, NAD Speaking in full sentences wo difficulties  ASSESSMENT and PLAN  1. Encounter by telehealth for suspected COVID-19 Clinically stable. Following quarantine. Pending testing results. Would be candidate for infusion as long as results available within 10 day window.  FOLLOW-UP: prn   The above assessment and management plan was discussed with the patient. The patient verbalized understanding of and has agreed to the management plan. Patient is aware to call the clinic if symptoms persist or  worsen. Patient is aware when to return to the clinic for a follow-up visit. Patient educated on when it is appropriate to go to the emergency department.     Myles Lipps, MD Primary Care at [facility] [address] [place], [place] [address] Ph.  [phone] Fax [phone]

## 2020-02-26 ENCOUNTER — Ambulatory Visit
Admission: EM | Admit: 2020-02-26 | Discharge: 2020-02-26 | Disposition: A | Discharge status: Home / Self Care | Payer: BC Managed Care – PPO | Attending: Emergency Medicine | Admitting: Emergency Medicine

## 2020-02-26 ENCOUNTER — Encounter: Payer: Self-pay | Admitting: Emergency Medicine

## 2020-02-26 ENCOUNTER — Other Ambulatory Visit: Payer: Self-pay

## 2020-02-26 DIAGNOSIS — J0100 Acute maxillary sinusitis, unspecified: Secondary | ICD-10-CM | POA: Diagnosis not present

## 2020-02-26 MED ORDER — FLUTICASONE PROPIONATE 50 MCG/ACT NA SUSP: 1.0000 | Freq: Every day | NASAL | 0 refills | Status: DC

## 2020-02-26 MED ORDER — PREDNISONE 50 MG PO TABS: 50.0000 mg | ORAL_TABLET | Freq: Every day | ORAL | 0 refills | Status: DC

## 2020-02-26 NOTE — ED Provider Notes (Signed)
[facility]    CSN: [id number] Arrival date & time: 02/26/20  0834      History   Chief Complaint Chief Complaint  Patient presents with  . Facial Pain    Pressure to face    HPI [name] is a 75 y.o. female  With history as below presenting for right-sided maxillary sinus tenderness and pain x3-4 days.  No fever, sore throat, cough.  States that she otherwise feels well.  States it does extend into her right upper teeth.  Mild ear discomfort.  Last bacterial sinus infection in 2018.  Is not COVID vaccinated.  No known sick contacts.  Past Medical History:  Diagnosis Date  . Cystocele with prolapse    VAULT PROLAPSE  . History of kidney stones   . Macular degeneration BOTH EYES    Patient Active Problem List   Diagnosis Date Noted  . Essential hypertension 10/28/2018  . Cystocele with prolapse 12/09/2017  . SUI (stress urinary incontinence, female) 07/17/2016  . 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 BREAST ENHANCEMENT SURGERY  2007  . CYSTOCELE REPAIR N/A 12/09/2017   Procedure: ANTERIOR REPAIR (CYSTOCELE);  Surgeon: Alfredo Martinez, MD;  Location: [facility];  Service: Urology;  Laterality: N/A;  . CYSTOSCOPY N/A 12/09/2017   Procedure: CYSTOSCOPY;  Surgeon: Alfredo Martinez, MD;  Location: [facility];  Service: Urology;  Laterality: N/A;  . NECK MASS EXCISION  2012  . TUBAL LIGATION  1984  . VAGINAL HYSTERECTOMY  2007   PARTIAL, AND BLADDER TACH DONE  . VAGINAL PROLAPSE REPAIR N/A 12/09/2017   Procedure: VAGINAL VAULT PROLAPSE REPAIR WITH GRAFT;  Surgeon: Alfredo Martinez, MD;  Location: [facility];  Service: Urology;  Laterality: N/A;    OB History   No obstetric history on file.      Home Medications    Prior to Admission medications   Medication Sig Start Date End Date Taking? Authorizing Provider  fluticasone (FLONASE) 50 MCG/ACT nasal spray Place 1 spray into both nostrils daily.  02/26/20  Yes Hall-Potvin, [place], PA-C  predniSONE (DELTASONE) 50 MG tablet Take 1 tablet (50 mg total) by mouth daily with breakfast. 02/26/20  Yes Hall-Potvin, [place], PA-C  EPINEPHrine 0.3 mg/0.3 mL IJ SOAJ injection Inject 0.3 mLs (0.3 mg total) into the muscle as needed for up to 1 dose for anaphylaxis. 09/16/19   Claude Manges, PA-C  losartan-hydrochlorothiazide (HYZAAR) 50-12.5 MG tablet Take 1 tablet by mouth daily. 11/02/19   Georgina Quint, MD  sertraline (ZOLOFT) 25 MG tablet Take 1 tablet (25 mg total) by mouth daily. 11/02/19   Georgina Quint, MD    Family History Family History  Problem Relation Age of Onset  . Stroke Father   . Colon cancer Neg Hx     Social History Social History   Tobacco Use  . Smoking status: Never Smoker  . Smokeless tobacco: Never Used  Vaping Use  . Vaping Use: Never used  Substance Use Topics  . Alcohol use: No  . Drug use: No     Allergies   Yellow jacket venom [bee venom]   Review of Systems Review of Systems  Constitutional: Negative for fatigue and fever.  HENT: Positive for dental problem, sinus pressure and sinus pain. Negative for congestion, ear pain, facial swelling, hearing loss, sore throat, trouble swallowing and voice change.   Eyes: Negative for photophobia, pain and visual disturbance.  Respiratory: Negative for cough and shortness  of breath.   Cardiovascular: Negative for chest pain and palpitations.  Gastrointestinal: Negative for diarrhea and vomiting.  Musculoskeletal: Negative for arthralgias and myalgias.  Neurological: Negative for dizziness and headaches.     Physical Exam Triage Vital Signs ED Triage Vitals  Enc Vitals Group     BP 02/26/20 0933 134/83     Pulse Rate 02/26/20 0933 92     Resp 02/26/20 0933 16     Temp 02/26/20 0933 97.9 F (36.6 C)     Temp Source 02/26/20 0933 Oral     SpO2 02/26/20 0933 95 %     Weight --      Height --      Head Circumference --      Peak Flow --       Pain Score 02/26/20 0934 5     Pain Loc --      Pain Edu? --      Excl. in GC? --    No data found.  Updated Vital Signs BP 134/83   Pulse 92   Temp 97.9 F (36.6 C) (Oral)   Resp 16   SpO2 95%   Visual Acuity Right Eye Distance:   Left Eye Distance:   Bilateral Distance:    Right Eye Near:   Left Eye Near:    Bilateral Near:     Physical Exam Constitutional:      General: She is not in acute distress. HENT:     Head: Normocephalic and atraumatic.     Jaw: There is normal jaw occlusion. No tenderness or pain on movement.     Right Ear: Hearing, ear canal and external ear normal. No tenderness. No mastoid tenderness.     Left Ear: Hearing, tympanic membrane, ear canal and external ear normal. No tenderness. No mastoid tenderness.     Ears:     Comments: Mild retraction of right TM.  Bony landmarks intact without suppurativa     Nose: No nasal deformity, septal deviation or nasal tenderness.     Right Turbinates: Not swollen or pale.     Left Turbinates: Not swollen or pale.     Right Sinus: No maxillary sinus tenderness or frontal sinus tenderness.     Left Sinus: No maxillary sinus tenderness or frontal sinus tenderness.     Comments: Bilateral turbinate edema (R>L) with right-sided maxillary sinus tenderness.  No crepitus, swelling.    Mouth/Throat:     Lips: Pink. No lesions.     Mouth: Mucous membranes are moist. No injury.     Pharynx: Oropharynx is clear. Uvula midline. No posterior oropharyngeal erythema or uvula swelling.     Comments: no tonsillar exudate or hypertrophy Cardiovascular:     Rate and Rhythm: Normal rate.  Pulmonary:     Effort: Pulmonary effort is normal.  Musculoskeletal:     Cervical back: Normal range of motion and neck supple. No muscular tenderness.  Lymphadenopathy:     Cervical: No cervical adenopathy.  Neurological:     Mental Status: She is alert and oriented to person, place, and time.      UC Treatments / Results   Labs (all labs ordered are listed, but only abnormal results are displayed) Labs Reviewed - No data to display  EKG   Radiology No results found.  Procedures Procedures (including critical care time)  Medications Ordered in UC Medications - No data to display  Initial Impression / Assessment and Plan / UC Course  I have reviewed the  triage vital signs and the nursing notes.  Pertinent labs & imaging results that were available during my care of the patient were reviewed by me and considered in my medical decision making (see chart for details).     Patient febrile, nontoxic.  Plan COVID testing.  Patient does have right-sided maxillary sinus tenderness, pressure that is affecting eustachian tube.  Has not tried intranasal steroid therapy.  Patient does have short duration without frequent, recurrent bacterial sinusitis.  Will trial steroid therapy as below, follow-up for persistent or worsening symptoms as we would consider ABS at that time.  Return precautions discussed, pt verbalized understanding and is agreeable to plan. Final Clinical Impressions(s) / UC Diagnoses   Final diagnoses:  Acute non-recurrent maxillary sinusitis     Discharge Instructions     Steroid once a day with breakfast. Follow up with dentist if needed.    ED Prescriptions    Medication Sig Dispense Auth. Provider   predniSONE (DELTASONE) 50 MG tablet Take 1 tablet (50 mg total) by mouth daily with breakfast. 5 tablet Hall-Potvin, [place], PA-C   fluticasone (FLONASE) 50 MCG/ACT nasal spray Place 1 spray into both nostrils daily. 16 g Hall-Potvin, [place], PA-C     PDMP not reviewed this encounter.   Hall-Potvin, [place], [place] 02/26/20 1026

## 2020-02-26 NOTE — ED Triage Notes (Signed)
Pressure in face and eye are puffy. Sx'x more on RT side.

## 2020-02-26 NOTE — Discharge Instructions (Addendum)
Steroid once a day with breakfast. Follow up with dentist if needed.

## 2020-04-04 ENCOUNTER — Other Ambulatory Visit: Payer: Self-pay | Admitting: Emergency Medicine

## 2020-04-04 ENCOUNTER — Other Ambulatory Visit: Payer: Self-pay

## 2020-04-04 DIAGNOSIS — I10 Essential (primary) hypertension: Secondary | ICD-10-CM

## 2020-04-04 NOTE — Telephone Encounter (Signed)
   Notes to clinic:   Product Backordered/Unavailable:LONG TERM BACKORDER PLEASE ADVISE.  Requested Prescriptions  Pending Prescriptions Disp Refills   losartan-hydrochlorothiazide (HYZAAR) 50-12.5 MG tablet [Pharmacy Med Name: LOSARTAN-HCTZ 50-12.5 MG TAB] 90 tablet 3    Sig: TAKE 1 TABLET BY MOUTH EVERY DAY      Cardiovascular: ARB + Diuretic Combos Failed - 04/04/2020 10:59 AM      Failed - K in normal range and within 180 days    Potassium  Date Value Ref Range Status  09/15/2019 3.4 (L) 3.5 - 5.1 mmol/L Final          Failed - Na in normal range and within 180 days    Sodium  Date Value Ref Range Status  09/15/2019 137 135 - 145 mmol/L Final          Failed - Cr in normal range and within 180 days    Creatinine, Ser  Date Value Ref Range Status  09/15/2019 0.93 0.44 - 1.00 mg/dL Final          Failed - Ca in normal range and within 180 days    Calcium  Date Value Ref Range Status  09/15/2019 9.4 8.9 - 10.3 mg/dL Final          Passed - Patient is not pregnant      Passed - Last BP in normal range    BP Readings from Last 1 Encounters:  02/26/20 134/83          Passed - Valid encounter within last 6 months    Recent Outpatient Visits           4 months ago Encounter by telehealth for suspected COVID-19   Primary Care at [facility], Meda Coffee, MD   5 months ago Essential hypertension   Primary Care at [place], Eilleen Kempf, MD   11 months ago Essential hypertension   Primary Care at [place], Eilleen Kempf, MD   1 year ago Essential hypertension   Primary Care at [place], Eilleen Kempf, MD   1 year ago Social anxiety disorder   Primary Care at [place], Eilleen Kempf, MD       Future Appointments             In 2 weeks Sagardia, Eilleen Kempf, MD Primary Care at [place], [facility]

## 2020-04-04 NOTE — Telephone Encounter (Signed)
  Notes to clinic:   Product Backordered/Unavailable:LONG TERM BACKORDER PLEASE ADVISE  Requested Prescriptions  Pending Prescriptions Disp Refills   losartan-hydrochlorothiazide (HYZAAR) 50-12.5 MG tablet [Pharmacy Med Name: LOSARTAN-HCTZ 50-12.5 MG TAB] 90 tablet 3    Sig: TAKE 1 TABLET BY MOUTH EVERY DAY      Cardiovascular: ARB + Diuretic Combos Failed - 04/04/2020 10:20 AM      Failed - K in normal range and within 180 days    Potassium  Date Value Ref Range Status  09/15/2019 3.4 (L) 3.5 - 5.1 mmol/L Final          Failed - Na in normal range and within 180 days    Sodium  Date Value Ref Range Status  09/15/2019 137 135 - 145 mmol/L Final          Failed - Cr in normal range and within 180 days    Creatinine, Ser  Date Value Ref Range Status  09/15/2019 0.93 0.44 - 1.00 mg/dL Final          Failed - Ca in normal range and within 180 days    Calcium  Date Value Ref Range Status  09/15/2019 9.4 8.9 - 10.3 mg/dL Final          Passed - Patient is not pregnant      Passed - Last BP in normal range    BP Readings from Last 1 Encounters:  02/26/20 134/83          Passed - Valid encounter within last 6 months    Recent Outpatient Visits           4 months ago Encounter by telehealth for suspected COVID-19   [facility] at [facility], Meda Coffee, MD   5 months ago Essential hypertension   Primary Care at [place], Eilleen Kempf, MD   11 months ago Essential hypertension   Primary Care at [place], Eilleen Kempf, MD   1 year ago Essential hypertension   Primary Care at [place], Eilleen Kempf, MD   1 year ago Social anxiety disorder   Primary Care at [place], Eilleen Kempf, MD       Future Appointments             In 2 weeks Sagardia, Eilleen Kempf, MD Primary Care at [place], [facility]

## 2020-04-04 NOTE — Telephone Encounter (Signed)
Please send alternative to this medication for it is on long term back order.

## 2020-04-18 ENCOUNTER — Ambulatory Visit (INDEPENDENT_AMBULATORY_CARE_PROVIDER_SITE_OTHER): Payer: BC Managed Care – PPO | Admitting: Emergency Medicine

## 2020-04-18 ENCOUNTER — Other Ambulatory Visit: Payer: Self-pay

## 2020-04-18 ENCOUNTER — Encounter: Payer: Self-pay | Admitting: Emergency Medicine

## 2020-04-18 DIAGNOSIS — I10 Essential (primary) hypertension: Secondary | ICD-10-CM | POA: Diagnosis not present

## 2020-04-18 NOTE — Progress Notes (Signed)
[name] 75 y.o.   Chief Complaint  Patient presents with  . Hypertension    Follow up 6 month    HISTORY OF PRESENT ILLNESS: This is a 75 y.o. female with history of hypertension here for 59-month follow-up. Losartan recently changed to valsartan due to shortage.  Presently on valsartan-hydrochlorothiazide 80-12.5 mg daily. Has no complaints or medical concerns today. Up-to-date with mammogram and colonoscopy.  HPI   Prior to Admission medications   Medication Sig Start Date End Date Taking? Authorizing Provider  EPINEPHrine 0.3 mg/0.3 mL IJ SOAJ injection Inject 0.3 mLs (0.3 mg total) into the muscle as needed for up to 1 dose for anaphylaxis. 09/16/19  Yes Soto, Leonie Douglas, PA-C  Misc Natural Products (LUTEIN 20) CAPS Take by mouth at bedtime.   Yes [provider]  sertraline (ZOLOFT) 25 MG tablet Take 1 tablet (25 mg total) by mouth daily. 11/02/19  Yes Valarie Farace, Eilleen Kempf, MD  fluticasone [facility]) 50 MCG/ACT nasal spray Place 1 spray into both nostrils daily. Patient not taking: Reported on 04/18/2020 02/26/20   Hall-Potvin, [place], PA-C  predniSONE (DELTASONE) 50 MG tablet Take 1 tablet (50 mg total) by mouth daily with breakfast. Patient not taking: Reported on 04/18/2020 02/26/20   Hall-Potvin, [place], PA-C  valsartan-hydrochlorothiazide (DIOVAN HCT) 80-12.5 MG tablet Take 1 tablet by mouth daily. Patient not taking: Reported on 04/18/2020 04/04/20 07/03/20  Georgina Quint, MD    Allergies  Allergen Reactions  . Yellow Jacket Venom [Bee Venom] Rash    Patient Active Problem List   Diagnosis Date Noted  . Essential hypertension 10/28/2018  . Cystocele with prolapse 12/09/2017  . SUI (stress urinary incontinence, female) 07/17/2016  . 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 Cystocele with prolapse    VAULT PROLAPSE  . History of kidney stones   . Macular degeneration BOTH EYES    Past Surgical History:   Procedure Laterality Date  . BREAST ENHANCEMENT SURGERY  2007  . CYSTOCELE REPAIR N/A 12/09/2017   Procedure: ANTERIOR REPAIR (CYSTOCELE);  Surgeon: Alfredo Martinez, MD;  Location: [facility];  Service: Urology;  Laterality: N/A;  . CYSTOSCOPY N/A 12/09/2017   Procedure: CYSTOSCOPY;  Surgeon: Alfredo Martinez, MD;  Location: [facility];  Service: Urology;  Laterality: N/A;  . NECK MASS EXCISION  2012  . TUBAL LIGATION  1984  . VAGINAL HYSTERECTOMY  2007   PARTIAL, AND BLADDER TACH DONE  . VAGINAL PROLAPSE REPAIR N/A 12/09/2017   Procedure: VAGINAL VAULT PROLAPSE REPAIR WITH GRAFT;  Surgeon: Alfredo Martinez, MD;  Location: [facility];  Service: Urology;  Laterality: N/A;    Social History   Socioeconomic History  . Marital status: Married    Spouse name: Not on file  . Number of children: Not on file  . Years of education: Not on file  . Highest education level: Not on file  Occupational History  . Not on file  Tobacco Use  . Smoking status: Never Smoker  . Smokeless tobacco: Never Used  Vaping Use  . Vaping Use: Never used  Substance and Sexual Activity  . Alcohol use: No  . Drug use: No  . Sexual activity: Not on file  Other Topics Concern  . Not on file  Social History Narrative  . Not on file   Social Determinants of Health   Financial Resource Strain: Not on file  Food Insecurity: Not on file  Transportation Needs: Not on file  Physical Activity:  Not on file  Stress: Not on file  Social Connections: Not on file  Intimate Partner Violence: Not on file    Family History  Problem Relation Age of Onset  . Stroke Father   . Colon cancer Neg Hx      Review of Systems  Constitutional: Negative.  Negative for chills and fever.  HENT: Negative.  Negative for congestion and sore throat.   Respiratory: Negative.  Negative for cough and shortness of breath.   Cardiovascular: Negative.  Negative for chest pain and palpitations.  Gastrointestinal: Negative.  Negative for  abdominal pain, blood in stool, diarrhea, melena, nausea and vomiting.  Genitourinary: Negative.  Negative for dysuria and hematuria.  Musculoskeletal: Negative.  Negative for back pain, myalgias and neck pain.  Skin: Negative.  Negative for rash.  Neurological: Negative.  Negative for dizziness and headaches.  All other systems reviewed and are negative.   Vitals:   04/18/20 1028  BP: 130/80  Pulse: 71  Resp: 16  Temp: 98.6 F (37 C)  SpO2: 96%   Wt Readings from Last 3 Encounters:  04/18/20 152 lb (68.9 kg)  11/02/19 149 lb (67.6 kg)  09/15/19 152 lb 1.9 oz (69 kg)    Physical Exam Vitals reviewed.  Constitutional:      Appearance: Normal appearance.  HENT:     Head: Normocephalic.  Eyes:     Extraocular Movements: Extraocular movements intact.     Pupils: Pupils are equal, round, and reactive to light.  Cardiovascular:     Rate and Rhythm: Normal rate and regular rhythm.     Pulses: Normal pulses.     Heart sounds: Normal heart sounds.  Pulmonary:     Effort: Pulmonary effort is normal.     Breath sounds: Normal breath sounds.  Musculoskeletal:        General: Normal range of motion.     Cervical back: Normal range of motion.  Skin:    General: Skin is warm and dry.     Capillary Refill: Capillary refill takes less than 2 seconds.  Neurological:     General: No focal deficit present.     Mental Status: She is alert and oriented to person, place, and time.  Psychiatric:        Mood and Affect: Mood normal.        Behavior: Behavior normal.      ASSESSMENT & PLAN: Essential hypertension Well-controlled hypertension.  Continue valsartan-hydrochlorothiazide 80-12.5 mg daily. Follow-up in 6 months.  [name] was seen today for hypertension.  Diagnoses and all orders for this visit:  Essential hypertension    Patient Instructions       If you have lab work done today you will be contacted with your lab results within the next 2 weeks.  If you have  not heard from [place] then please contact us. The fastest way to get your results is to register for My Chart.   IF you received an x-ray today, you will receive an invoice from [facility]. Please contact [facility] at [phone] with questions or concerns regarding your invoice.   IF you received labwork today, you will receive an invoice from [facility]. Please contact [facility] at [phone] with questions or concerns regarding your invoice.   Our billing staff will not be able to assist you with questions regarding bills from these companies.  You will be contacted with the lab results as soon as they are available. The fastest way to get your results is  to activate your My Chart account. Instructions are located on the last page of this paperwork. If you have not heard from us regarding the results in 2 weeks, please contact this office.     Health Maintenance After Age 73 After age 73, you are at a higher risk for certain long-term diseases and infections as well as injuries from falls. Falls are a major cause of broken bones and head injuries in people who are older than age 73. Getting regular preventive care can help to keep you healthy and well. Preventive care includes getting regular testing and making lifestyle changes as recommended by your health care provider. Talk with your health care provider about:  Which screenings and tests you should have. A screening is a test that checks for a disease when you have no symptoms.  A diet and exercise plan that is right for you. What should I know about screenings and tests to prevent falls? Screening and testing are the best ways to find a health problem early. Early diagnosis and treatment give you the best chance of managing medical conditions that are common after age 73. Certain conditions and lifestyle choices may make you more likely to have a fall. Your health care provider may recommend:  Regular vision checks.  Poor vision and conditions such as cataracts can make you more likely to have a fall. If you wear glasses, make sure to get your prescription updated if your vision changes.  Medicine review. Work with your health care provider to regularly review all of the medicines you are taking, including over-the-counter medicines. Ask your health care provider about any side effects that may make you more likely to have a fall. Tell your health care provider if any medicines that you take make you feel dizzy or sleepy.  Osteoporosis screening. Osteoporosis is a condition that causes the bones to get weaker. This can make the bones weak and cause them to break more easily.  Blood pressure screening. Blood pressure changes and medicines to control blood pressure can make you feel dizzy.  Strength and balance checks. Your health care provider may recommend certain tests to check your strength and balance while standing, walking, or changing positions.  Foot health exam. Foot pain and numbness, as well as not wearing proper footwear, can make you more likely to have a fall.  Depression screening. You may be more likely to have a fall if you have a fear of falling, feel emotionally low, or feel unable to do activities that you used to do.  Alcohol use screening. Using too much alcohol can affect your balance and may make you more likely to have a fall. What actions can I take to lower my risk of falls? General instructions  Talk with your health care provider about your risks for falling. Tell your health care provider if: ? You fall. Be sure to tell your health care provider about all falls, even ones that seem minor. ? You feel dizzy, sleepy, or off-balance.  Take over-the-counter and prescription medicines only as told by your health care provider. These include any supplements.  Eat a healthy diet and maintain a healthy weight. A healthy diet includes low-fat dairy products, low-fat (lean) meats, and  fiber from whole grains, beans, and lots of fruits and vegetables. Home safety  Remove any tripping hazards, such as rugs, cords, and clutter.  Install safety equipment such as grab bars in bathrooms and safety rails on stairs.  Keep rooms and walkways well-lit.  Activity  Follow a regular exercise program to stay fit. This will help you maintain your balance. Ask your health care provider what types of exercise are appropriate for you.  If you need a cane or walker, use it as recommended by your health care provider.  Wear supportive shoes that have nonskid soles.   Lifestyle  Do not drink alcohol if your health care provider tells you not to drink.  If you drink alcohol, limit how much you have: ? 0-1 drink a day for women. ? 0-2 drinks a day for men.  Be aware of how much alcohol is in your drink. In the [place] one drink equals one typical bottle of beer (12 oz), one-half glass of wine (5 oz), or one shot of hard liquor (1 oz).  Do not use any products that contain nicotine or tobacco, such as cigarettes and e-cigarettes. If you need help quitting, ask your health care provider. Summary  Having a healthy lifestyle and getting preventive care can help to protect your health and wellness after age 25.  Screening and testing are the best way to find a health problem early and help you avoid having a fall. Early diagnosis and treatment give you the best chance for managing medical conditions that are more common for people who are older than age 62.  Falls are a major cause of broken bones and head injuries in people who are older than age 23. Take precautions to prevent a fall at home.  Work with your health care provider to learn what changes you can make to improve your health and wellness and to prevent falls. This information is not intended to replace advice given to you by your health care provider. Make sure you discuss any questions you have with your health care  provider. Document Revised: 05/21/2018 Document Reviewed: 12/11/2016 Elsevier Patient Education  2021 Elsevier Inc.      Edwina Barth, MD [facility]

## 2020-04-18 NOTE — Assessment & Plan Note (Signed)
Well-controlled hypertension.  Continue valsartan-hydrochlorothiazide 80-12.5 mg daily. Follow-up in 6 months.

## 2020-04-18 NOTE — Patient Instructions (Addendum)
   If you have lab work done today you will be contacted with your lab results within the next 2 weeks.  If you have not heard from us then please contact us. The fastest way to get your results is to register for My Chart.   IF you received an x-ray today, you will receive an invoice from [facility]. Please contact [facility] at [phone] with questions or concerns regarding your invoice.   IF you received labwork today, you will receive an invoice from [facility]. Please contact [facility] at [phone] with questions or concerns regarding your invoice.   Our billing staff will not be able to assist you with questions regarding bills from these companies.  You will be contacted with the lab results as soon as they are available. The fastest way to get your results is to activate your My Chart account. Instructions are located on the last page of this paperwork. If you have not heard from us regarding the results in 2 weeks, please contact this office.       Health Maintenance After Age 65 After age 65, you are at a higher risk for certain long-term diseases and infections as well as injuries from falls. Falls are a major cause of broken bones and head injuries in people who are older than age 65. Getting regular preventive care can help to keep you healthy and well. Preventive care includes getting regular testing and making lifestyle changes as recommended by your health care provider. Talk with your health care provider about:  Which screenings and tests you should have. A screening is a test that checks for a disease when you have no symptoms.  A diet and exercise plan that is right for you. What should I know about screenings and tests to prevent falls? Screening and testing are the best ways to find a health problem early. Early diagnosis and treatment give you the best chance of managing medical conditions that are common after age 65. Certain conditions and  lifestyle choices may make you more likely to have a fall. Your health care provider may recommend:  Regular vision checks. Poor vision and conditions such as cataracts can make you more likely to have a fall. If you wear glasses, make sure to get your prescription updated if your vision changes.  Medicine review. Work with your health care provider to regularly review all of the medicines you are taking, including over-the-counter medicines. Ask your health care provider about any side effects that may make you more likely to have a fall. Tell your health care provider if any medicines that you take make you feel dizzy or sleepy.  Osteoporosis screening. Osteoporosis is a condition that causes the bones to get weaker. This can make the bones weak and cause them to break more easily.  Blood pressure screening. Blood pressure changes and medicines to control blood pressure can make you feel dizzy.  Strength and balance checks. Your health care provider may recommend certain tests to check your strength and balance while standing, walking, or changing positions.  Foot health exam. Foot pain and numbness, as well as not wearing proper footwear, can make you more likely to have a fall.  Depression screening. You may be more likely to have a fall if you have a fear of falling, feel emotionally low, or feel unable to do activities that you used to do.  Alcohol use screening. Using too much alcohol can affect your balance and may make you more   likely to have a fall. What actions can I take to lower my risk of falls? General instructions  Talk with your health care provider about your risks for falling. Tell your health care provider if: ? You fall. Be sure to tell your health care provider about all falls, even ones that seem minor. ? You feel dizzy, sleepy, or off-balance.  Take over-the-counter and prescription medicines only as told by your health care provider. These include any  supplements.  Eat a healthy diet and maintain a healthy weight. A healthy diet includes low-fat dairy products, low-fat (lean) meats, and fiber from whole grains, beans, and lots of fruits and vegetables. Home safety  Remove any tripping hazards, such as rugs, cords, and clutter.  Install safety equipment such as grab bars in bathrooms and safety rails on stairs.  Keep rooms and walkways well-lit. Activity  Follow a regular exercise program to stay fit. This will help you maintain your balance. Ask your health care provider what types of exercise are appropriate for you.  If you need a cane or walker, use it as recommended by your health care provider.  Wear supportive shoes that have nonskid soles.   Lifestyle  Do not drink alcohol if your health care provider tells you not to drink.  If you drink alcohol, limit how much you have: ? 0-1 drink a day for women. ? 0-2 drinks a day for men.  Be aware of how much alcohol is in your drink. In the [place] one drink equals one typical bottle of beer (12 oz), one-half glass of wine (5 oz), or one shot of hard liquor (1 oz).  Do not use any products that contain nicotine or tobacco, such as cigarettes and e-cigarettes. If you need help quitting, ask your health care provider. Summary  Having a healthy lifestyle and getting preventive care can help to protect your health and wellness after age 65.  Screening and testing are the best way to find a health problem early and help you avoid having a fall. Early diagnosis and treatment give you the best chance for managing medical conditions that are more common for people who are older than age 65.  Falls are a major cause of broken bones and head injuries in people who are older than age 65. Take precautions to prevent a fall at home.  Work with your health care provider to learn what changes you can make to improve your health and wellness and to prevent falls. This information is not intended  to replace advice given to you by your health care provider. Make sure you discuss any questions you have with your health care provider. Document Revised: 05/21/2018 Document Reviewed: 12/11/2016 Elsevier Patient Education  2021 Elsevier Inc.  

## 2020-06-24 ENCOUNTER — Other Ambulatory Visit: Payer: Self-pay | Admitting: Emergency Medicine

## 2020-06-24 DIAGNOSIS — I10 Essential (primary) hypertension: Secondary | ICD-10-CM

## 2020-09-05 IMAGING — DX DG CHEST 1V PORT
1 series · 1 of 1 positions shown · non-contrast
Comparison: 09/27/2018

CLINICAL DATA: Shortness of breath

EXAM:
PORTABLE CHEST 1 VIEW

[chest ap]
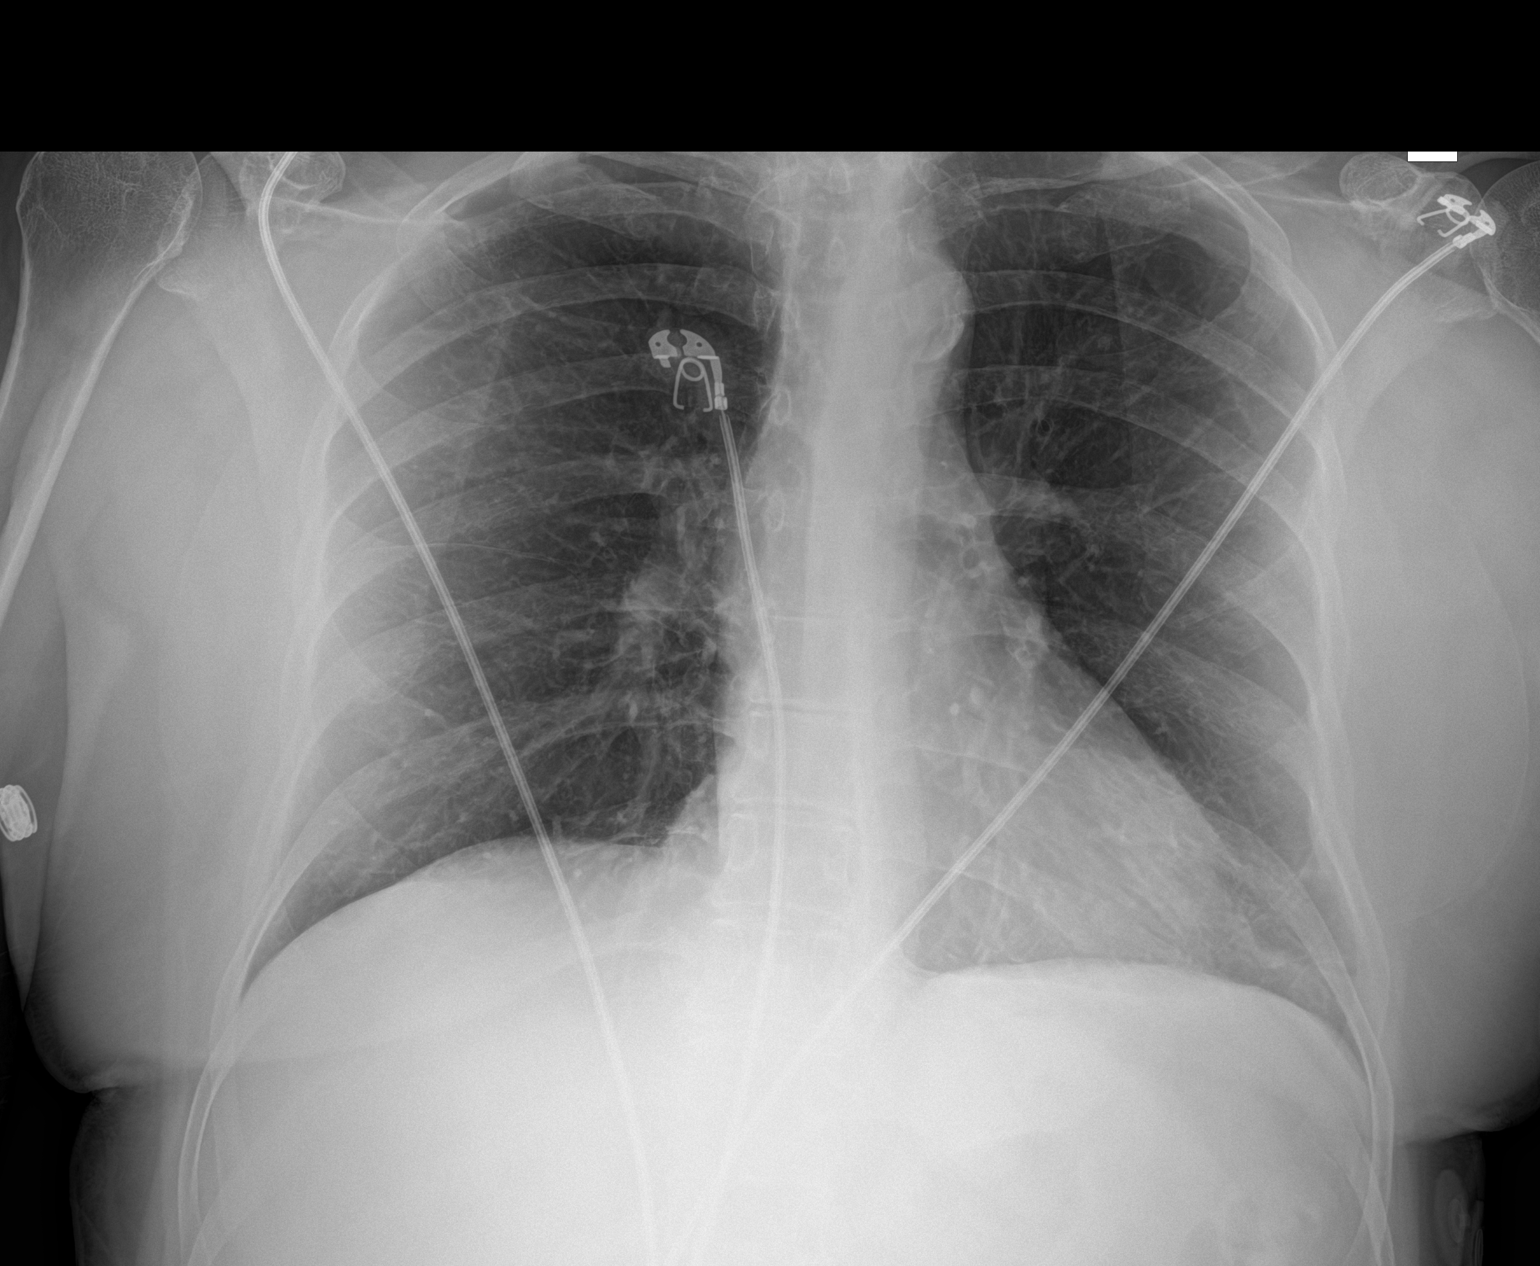

[1 of 1 positions shown; findings below may reference images not displayed]

FINDINGS: No focal opacity or pleural effusion. Normal heart size. Aortic
atherosclerosis. No pneumothorax.
IMPRESSION: No active disease.

## 2020-09-10 ENCOUNTER — Ambulatory Visit (HOSPITAL_COMMUNITY)
Admission: EM | Admit: 2020-09-10 | Discharge: 2020-09-10 | Disposition: A | Discharge status: Home / Self Care | Payer: BC Managed Care – PPO | Attending: Family Medicine | Admitting: Family Medicine

## 2020-09-10 ENCOUNTER — Other Ambulatory Visit: Payer: Self-pay

## 2020-09-10 DIAGNOSIS — M79605 Pain in left leg: Secondary | ICD-10-CM

## 2020-09-10 DIAGNOSIS — M25552 Pain in left hip: Secondary | ICD-10-CM | POA: Diagnosis not present

## 2020-09-10 MED ORDER — CYCLOBENZAPRINE HCL 5 MG PO TABS: 5.0000 mg | ORAL_TABLET | Freq: Every evening | ORAL | 0 refills | Status: DC | PRN

## 2020-09-10 MED ORDER — PREDNISONE 20 MG PO TABS: 40.0000 mg | ORAL_TABLET | Freq: Every day | ORAL | 0 refills | Status: DC

## 2020-09-10 NOTE — ED Triage Notes (Signed)
Pt present left side hip pain, pt states it her sciatica nerve . Pt state the pain is effecting her walk and getting in and out of bed.

## 2020-09-10 NOTE — ED Provider Notes (Signed)
[facility]    CSN: [id number] Arrival date & time: 09/10/20  1121      History   Chief Complaint Chief Complaint  Patient presents with   Hip Pain    HPI [name] is a 75 y.o. female.   Patient presenting today with 1 to 2-week history of worsening left posterior hip pain going down the left leg at times.  States it seemed to start after taking 2 long RV trips where she was sitting in the seat for long periods of time.  Denies any numbness, tingling, swelling, low back pain, bowel or bladder incontinence, saddle paresthesias, fevers, chills, urinary symptoms.  Taking ibuprofen and Tylenol with mild temporary relief.  Has a history of sciatic nerve issues that have felt similar.   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    Past Surgical History:  Procedure Laterality Date   BREAST ENHANCEMENT SURGERY  2007   CYSTOCELE REPAIR N/A 12/09/2017   Procedure: ANTERIOR REPAIR (CYSTOCELE);  Surgeon: Alfredo Martinez, MD;  Location: [facility];  Service: Urology;  Laterality: N/A;   CYSTOSCOPY N/A 12/09/2017   Procedure: CYSTOSCOPY;  Surgeon: Alfredo Martinez, MD;  Location: [facility];  Service: Urology;  Laterality: N/A;   NECK MASS EXCISION  2012   TUBAL LIGATION  1984   VAGINAL HYSTERECTOMY  2007   PARTIAL, AND BLADDER TACH DONE   VAGINAL PROLAPSE REPAIR N/A 12/09/2017   Procedure: VAGINAL VAULT PROLAPSE REPAIR WITH GRAFT;  Surgeon: Alfredo Martinez, MD;  Location: [facility];  Service: Urology;  Laterality: N/A;    OB History   No obstetric history on file.      Home Medications    Prior to Admission medications   Medication Sig Start  Date End Date Taking? Authorizing Provider  cyclobenzaprine (FLEXERIL) 5 MG tablet Take 1 tablet (5 mg total) by mouth at bedtime as needed for muscle spasms. Do not drink alcohol or drive while taking this medication.  May cause drowsiness. 09/10/20  Yes Particia Nearing, PA-C  predniSONE (DELTASONE) 20 MG tablet Take 2 tablets (40 mg total) by mouth daily with breakfast. 09/10/20  Yes Particia Nearing, PA-C  EPINEPHrine 0.3 mg/0.3 mL IJ SOAJ injection Inject 0.3 mLs (0.3 mg total) into the muscle as needed for up to 1 dose for anaphylaxis. 09/16/19   Claude Manges, PA-C  fluticasone (FLONASE) 50 MCG/ACT nasal spray Place 1 spray into both nostrils daily. Patient not taking: Reported on 04/18/2020 02/26/20   Hall-Potvin, [place], PA-C  losartan-hydrochlorothiazide (HYZAAR) 50-12.5 MG tablet Take 1 tablet by mouth daily. 06/26/20 09/24/20  Georgina Quint, MD  Misc Natural Products (LUTEIN 20) CAPS Take by mouth at bedtime.    [provider]  sertraline (ZOLOFT) 25 MG tablet Take 1 tablet (25 mg total) by mouth daily. 11/02/19   Georgina Quint, MD    Family History Family History  Problem Relation Age of Onset   Stroke Father    Colon cancer Neg Hx     Social History Social History   Tobacco Use   Smoking status: Never   Smokeless tobacco: Never  Vaping Use   Vaping  Use: Never used  Substance Use Topics   Alcohol use: No   Drug use: No     Allergies   Yellow jacket venom [bee venom]   Review of Systems Review of Systems Per HPI  Physical Exam Triage Vital Signs ED Triage Vitals  Enc Vitals Group     BP 09/10/20 1230 (!) 169/80     Pulse Rate 09/10/20 1230 63     Resp 09/10/20 1230 18     Temp 09/10/20 1230 98.7 F (37.1 C)     Temp src --      SpO2 09/10/20 1230 100 %     Weight --      Height --      Head Circumference --      Peak Flow --      Pain Score 09/10/20 1231 7     Pain Loc --      Pain Edu? --      Excl. in GC? --    No  data found.  Updated Vital Signs BP (!) 169/80 (BP Location: Right Arm)   Pulse 63   Temp 98.7 F (37.1 C)   Resp 18   SpO2 100%   Visual Acuity Right Eye Distance:   Left Eye Distance:   Bilateral Distance:    Right Eye Near:   Left Eye Near:    Bilateral Near:     Physical Exam Vitals and nursing note reviewed.  Constitutional:      Appearance: Normal appearance. She is not ill-appearing.  HENT:     Head: Atraumatic.  Eyes:     Extraocular Movements: Extraocular movements intact.     Conjunctiva/sclera: Conjunctivae normal.  Cardiovascular:     Rate and Rhythm: Normal rate and regular rhythm.     Heart sounds: Normal heart sounds.  Pulmonary:     Effort: Pulmonary effort is normal.     Breath sounds: Normal breath sounds.  Abdominal:     General: Bowel sounds are normal. There is no distension.     Palpations: Abdomen is soft.     Tenderness: There is no abdominal tenderness. There is no guarding.  Musculoskeletal:        General: Tenderness present. No swelling, deformity or signs of injury. Normal range of motion.     Cervical back: Normal range of motion and neck supple.     Right lower leg: No edema.     Left lower leg: No edema.     Comments: Left posterior buttock tender to palpation extending down laterally into hip and left leg.  Normal gait, strength full and equal bilateral lower extremities, negative straight leg raise bilateral lower extremities.  No midline spinal tenderness palpation diffusely  Skin:    General: Skin is warm and dry.  Neurological:     Mental Status: She is alert and oriented to person, place, and time.     Comments: Bilateral lower extremities neurovascularly intact  Psychiatric:        Mood and Affect: Mood normal.        Thought Content: Thought content normal.        Judgment: Judgment normal.     UC Treatments / Results  Labs (all labs ordered are listed, but only abnormal results are displayed) Labs Reviewed - No data  to display  EKG   Radiology No results found.  Procedures Procedures (including critical care time)  Medications Ordered in UC Medications - No data to display  Initial Impression /  Assessment and Plan / UC Course  I have reviewed the triage vital signs and the nursing notes.  Pertinent labs & imaging results that were available during my care of the patient were reviewed by me and considered in my medical decision making (see chart for details).     Suspect posterior buttock strain causing irritation to connecting soft tissue structures.  We will treat with prednisone, Flexeril, stretches, heat, rest.  Work note provided.  Follow-up for acutely worsening symptoms.  No red flag signs or symptoms today.  Final Clinical Impressions(s) / UC Diagnoses   Final diagnoses:  Left hip pain  Left leg pain   Discharge Instructions   None    ED Prescriptions     Medication Sig Dispense Auth. Provider   predniSONE (DELTASONE) 20 MG tablet Take 2 tablets (40 mg total) by mouth daily with breakfast. 6 tablet Particia Nearing, PA-C   cyclobenzaprine (FLEXERIL) 5 MG tablet Take 1 tablet (5 mg total) by mouth at bedtime as needed for muscle spasms. Do not drink alcohol or drive while taking this medication.  May cause drowsiness. 10 tablet Particia Nearing, [place]      PDMP not reviewed this encounter.   Particia Nearing, [place] 09/10/20 1354

## 2020-09-13 ENCOUNTER — Other Ambulatory Visit: Payer: Self-pay

## 2020-09-13 ENCOUNTER — Encounter: Payer: Self-pay | Admitting: Emergency Medicine

## 2020-09-13 ENCOUNTER — Ambulatory Visit: Payer: BC Managed Care – PPO | Admitting: Emergency Medicine

## 2020-09-13 VITALS — BP 132/80 | HR 78 | Ht 64.5 in | Wt 151.4 lb

## 2020-09-13 DIAGNOSIS — M25552 Pain in left hip: Secondary | ICD-10-CM | POA: Diagnosis not present

## 2020-09-13 DIAGNOSIS — M7062 Trochanteric bursitis, left hip: Secondary | ICD-10-CM

## 2020-09-13 MED ORDER — MELOXICAM 15 MG PO TABS: 15.0000 mg | ORAL_TABLET | Freq: Every day | ORAL | 0 refills | Status: DC

## 2020-09-13 MED ORDER — TRAMADOL HCL 50 MG PO TABS: 50.0000 mg | ORAL_TABLET | Freq: Three times a day (TID) | ORAL | 0 refills | Status: AC | PRN

## 2020-09-13 NOTE — Patient Instructions (Signed)
Hip Bursitis Hip bursitis is swelling of one or more fluid-filled sacs (bursae) in your hip joint. This condition can cause pain, and your symptoms may come and go over time. What are the causes? Repeated use of your hip muscles. Injury to the hip. Weak butt muscles. Bone spurs. Infection. In some cases, the cause may not be known. What increases the risk? You are more likely to develop this condition if: You had a past hip injury or hip surgery. You have a condition, such as arthritis, gout, diabetes, or thyroid disease. You have spine problems. You have one leg that is shorter than the other. You run a lot or do long-distance running. You play sports where there is a risk of injury or falling, such as football, martial arts, or skiing. What are the signs or symptoms? Symptoms may come and go, and they often include: Pain in the hip or groin area. Pain may get worse when you move your hip. Tenderness and swelling of the hip. In rare cases, the bursa may become infected. If this happens, you may get a fever, as well as have warmth and redness in the hip area. How is this treated? This condition is treated by: Resting your hip. Icing your hip. Wrapping the hip area with an elastic bandage (compression wrap). Keeping the hip raised. Other treatments may include medicine, draining fluid out of the bursa, or using crutches, a cane, or a walker. Surgery may be needed, but this is rare. Long-term treatment may include doing exercises to help your strength and flexibility. It may also include lifestyle changes like losing weight to lessen the strain on your hip. Follow these instructions at home: Managing pain, stiffness, and swelling   If told, put ice on the painful area. Put ice in a plastic bag. Place a towel between your skin and the bag. Leave the ice on for 20 minutes, 2-3 times a day. Raise your hip by putting a pillow under your hips while you lie down. Stop if you feel  pain. If told, put heat on the affected area. Do this as often as told by your doctor. Use a moist heat pack or a heating pad as told by your doctor. Place a towel between your skin and the heat source. Leave the heat on for 20-30 minutes. Take off the heat if your skin turns bright red. This is very important if you are unable to feel pain, heat, or cold. You may have a greater risk of getting burned. Activity Do not use your hip to support your body weight until your doctor says that you can. Use crutches, a cane, or a walker as told by your doctor. If the affected leg is one that you use to drive, ask your doctor if it is safe to drive. Rest and protect your hip as much as you can until you feel better. Return to your normal activities as told by your doctor. Ask your doctor what activities are safe for you. Do exercises as told by your doctor. General instructions Take over-the-counter and prescription medicines only as told by your doctor. Gently rub and stretch your injured area as often as is comfortable. Wear elastic bandages only as told by your doctor. If one of your legs is shorter than the other, get fitted for a shoe insert or orthotic. Keep a healthy weight. Follow instructions from your doctor. Keep all follow-up visits as told by your doctor. This is important. How is this prevented? Exercise regularly, as   told by your doctor. Wear the right shoes for the sport you play. Warm up and stretch before being active. Cool down and stretch after being active. Take breaks often from repeated activity. Avoid activities that bother your hip or cause pain. Avoid sitting down for a long time. Where to find more information American [employer]: orthoinfo.aaos.org Contact a doctor if: You have a fever. You have new symptoms. You have trouble walking or doing everyday activities. You have pain that gets worse or does not get better with medicine. Your skin  around your hip is red. You get a feeling of warmth in your hip area. Get help right away if: You cannot move your hip. You have very bad pain. You cannot control the muscles in your feet. Summary Hip bursitis is swelling of one or more fluid-filled sacs (bursae) in your hip joint. Symptoms often come and go over time. This condition is often treated by resting and icing the hip. It also may help to keep the area raised and wrapped in an elastic bandage. Other treatments may be needed. This information is not intended to replace advice given to you by your health care provider. Make sure you discuss any questions you have with your health care provider. Document Revised: 11/30/2018 Document Reviewed: 10/06/2017 Elsevier Patient Education  2022 Elsevier Inc.  

## 2020-09-13 NOTE — Progress Notes (Signed)
[name] 75 y.o.   Chief Complaint  Patient presents with   Office Visit    Left side sciatic pain    HISTORY OF PRESENT ILLNESS: This is a 75 y.o. female complaining of pain to her left hip that started about 1 week ago. Went to urgent care center last Sunday and was started on muscle relaxant and prednisone. Still having symptoms. Denies abdominal pain.  Denies bowel or bladder symptoms.  Denies fever or chills or flulike symptoms. No localized lumbar pain. Pain is sharp and constant, worse with movement, better with rest, nonradiating, and not associated with any other symptoms.  HPI   Prior to Admission medications   Medication Sig Start Date End Date Taking? Authorizing Provider  cyclobenzaprine (FLEXERIL) 5 MG tablet Take 1 tablet (5 mg total) by mouth at bedtime as needed for muscle spasms. Do not drink alcohol or drive while taking this medication.  May cause drowsiness. 09/10/20   Particia Nearing, PA-C  EPINEPHrine 0.3 mg/0.3 mL IJ SOAJ injection Inject 0.3 mLs (0.3 mg total) into the muscle as needed for up to 1 dose for anaphylaxis. 09/16/19   Claude Manges, PA-C  fluticasone (FLONASE) 50 MCG/ACT nasal spray Place 1 spray into both nostrils daily. Patient not taking: Reported on 04/18/2020 02/26/20   Hall-Potvin, [place], PA-C  losartan-hydrochlorothiazide (HYZAAR) 50-12.5 MG tablet Take 1 tablet by mouth daily. 06/26/20 09/24/20  Georgina Quint, MD  Misc Natural Products (LUTEIN 20) CAPS Take by mouth at bedtime.    [provider]  predniSONE (DELTASONE) 20 MG tablet Take 2 tablets (40 mg total) by mouth daily with breakfast. 09/10/20   Particia Nearing, PA-C  sertraline (ZOLOFT) 25 MG tablet Take 1 tablet (25 mg total) by mouth daily. 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    Past Medical History:  Diagnosis Date   Cystocele with prolapse    VAULT PROLAPSE   History of kidney stones    Macular degeneration BOTH EYES    Past Surgical History:  Procedure Laterality Date   BREAST ENHANCEMENT SURGERY  2007   CYSTOCELE REPAIR N/A 12/09/2017   Procedure: ANTERIOR REPAIR (CYSTOCELE);  Surgeon: Alfredo Martinez, MD;  Location: [facility];  Service: Urology;  Laterality: N/A;   CYSTOSCOPY N/A 12/09/2017   Procedure: CYSTOSCOPY;  Surgeon: Alfredo Martinez, MD;  Location: [facility];  Service: Urology;  Laterality: N/A;   NECK MASS EXCISION  2012   TUBAL LIGATION  1984   VAGINAL HYSTERECTOMY  2007   PARTIAL, AND BLADDER TACH DONE   VAGINAL PROLAPSE REPAIR N/A 12/09/2017   Procedure: VAGINAL VAULT PROLAPSE REPAIR WITH GRAFT;  Surgeon: Alfredo Martinez, MD;  Location: [facility];  Service: Urology;  Laterality: N/A;    Social History   Socioeconomic History   Marital status: Married    Spouse name: Not on file   Number of children: Not on file   Years of education: Not on file   Highest education level: Not on file  Occupational History   Not on file  Tobacco Use   Smoking status: Never   Smokeless tobacco: Never  Vaping Use   Vaping Use: Never used  Substance and Sexual  Activity   Alcohol use: No   Drug use: No   Sexual activity: Not on file  Other Topics Concern   Not on file  Social History Narrative   Not on file   Social Determinants of Health   Financial Resource Strain: Not on file  Food Insecurity: Not on file  Transportation Needs: Not on file  Physical Activity: Not on file  Stress: Not on file  Social Connections: Not on file  Intimate Partner Violence: Not on file    Family History  Problem Relation Age of Onset   Stroke Father    Colon cancer Neg Hx      Review of Systems  Constitutional: Negative.   Negative for chills and fever.  HENT: Negative.  Negative for congestion and sore throat.   Respiratory: Negative.  Negative for cough and shortness of breath.   Cardiovascular: Negative.  Negative for chest pain and palpitations.  Genitourinary: Negative.  Negative for dysuria and hematuria.  Skin: Negative.  Negative for rash.  Neurological: Negative.  Negative for dizziness and headaches.  All other systems reviewed and are negative.   Physical Exam Vitals reviewed.  Constitutional:      Appearance: Normal appearance.  HENT:     Head: Normocephalic.  Eyes:     Extraocular Movements: Extraocular movements intact.     Pupils: Pupils are equal, round, and reactive to light.  Cardiovascular:     Rate and Rhythm: Normal rate.  Pulmonary:     Effort: Pulmonary effort is normal.  Abdominal:     General: Bowel sounds are normal. There is no distension.     Palpations: Abdomen is soft.     Tenderness: There is no abdominal tenderness.  Musculoskeletal:     Cervical back: Normal range of motion.     Comments: Left hip: Localized tenderness with painful full range of motion. Lumbar sacral spine: No tenderness Left lower extremity: Neurovascularly intact.  No signs of DVT.  No tenderness.  Full range of motion at knee and ankle.  Skin:    General: Skin is warm and dry.  Neurological:     General: No focal deficit present.     Mental Status: She is alert and oriented to person, place, and time.     Sensory: No sensory deficit.     Motor: No weakness.     Deep Tendon Reflexes: Reflexes normal.  Psychiatric:        Mood and Affect: Mood normal.        Behavior: Behavior normal.     ASSESSMENT & PLAN: [name] was seen today for office visit.  Diagnoses and all orders for this visit:  Trochanteric bursitis of left hip -     meloxicam (MOBIC) 15 MG tablet; Take 1 tablet (15 mg total) by mouth daily.  Left hip pain -     traMADol (ULTRAM) 50 MG tablet; Take 1 tablet (50 mg total)  by mouth every 8 (eight) hours as needed for up to 5 days.  Patient Instructions  Hip Bursitis  Hip bursitis is swelling of one or more fluid-filled sacs (bursae) in your hip joint. This condition can cause pain, and your symptoms may comeand go over time. What are the causes? Repeated use of your hip muscles. Injury to the hip. Weak butt muscles. Bone spurs. Infection. In some cases, the cause may not be known. What increases the risk? You are more likely to develop this condition if: You had a past hip injury or  hip surgery. You have a condition, such as arthritis, gout, diabetes, or thyroid disease. You have spine problems. You have one leg that is shorter than the other. You run a lot or do long-distance running. You play sports where there is a risk of injury or falling, such as football, martial arts, or skiing. What are the signs or symptoms? Symptoms may come and go, and they often include: Pain in the hip or groin area. Pain may get worse when you move your hip. Tenderness and swelling of the hip. In rare cases, the bursa may become infected. If this happens, you may get afever, as well as have warmth and redness in the hip area. How is this treated? This condition is treated by: Resting your hip. Icing your hip. Wrapping the hip area with an elastic bandage (compression wrap). Keeping the hip raised. Other treatments may include medicine, draining fluid out of the bursa, orusing crutches, a cane, or a walker. Surgery may be needed, but this is rare. Long-term treatment may include doing exercises to help your strength and flexibility. It may also include lifestyle changes like losing weight to lessenthe strain on your hip. Follow these instructions at home: Managing pain, stiffness, and swelling     If told, put ice on the painful area. Put ice in a plastic bag. Place a towel between your skin and the bag. Leave the ice on for 20 minutes, 2-3 times a day. Raise  your hip by putting a pillow under your hips while you lie down. Stop if you feel pain. If told, put heat on the affected area. Do this as often as told by your doctor. Use a moist heat pack or a heating pad as told by your doctor. Place a towel between your skin and the heat source. Leave the heat on for 20-30 minutes. Take off the heat if your skin turns bright red. This is very important if you are unable to feel pain, heat, or cold. You may have a greater risk of getting burned. Activity Do not use your hip to support your body weight until your doctor says that you can. Use crutches, a cane, or a walker as told by your doctor. If the affected leg is one that you use to drive, ask your doctor if it is safe to drive. Rest and protect your hip as much as you can until you feel better. Return to your normal activities as told by your doctor. Ask your doctor what activities are safe for you. Do exercises as told by your doctor. General instructions Take over-the-counter and prescription medicines only as told by your doctor. Gently rub and stretch your injured area as often as is comfortable. Wear elastic bandages only as told by your doctor. If one of your legs is shorter than the other, get fitted for a shoe insert or orthotic. Keep a healthy weight. Follow instructions from your doctor. Keep all follow-up visits as told by your doctor. This is important. How is this prevented? Exercise regularly, as told by your doctor. Wear the right shoes for the sport you play. Warm up and stretch before being active. Cool down and stretch after being active. Take breaks often from repeated activity. Avoid activities that bother your hip or cause pain. Avoid sitting down for a long time. Where to find more information [employer]: orthoinfo.aaos.org Contact a doctor if: You have a fever. You have new symptoms. You have trouble walking or doing everyday  activities.  You have pain that gets worse or does not get better with medicine. Your skin around your hip is red. You get a feeling of warmth in your hip area. Get help right away if: You cannot move your hip. You have very bad pain. You cannot control the muscles in your feet. Summary Hip bursitis is swelling of one or more fluid-filled sacs (bursae) in your hip joint. Symptoms often come and go over time. This condition is often treated by resting and icing the hip. It also may help to keep the area raised and wrapped in an elastic bandage. Other treatments may be needed. This information is not intended to replace advice given to you by your health care provider. Make sure you discuss any questions you have with your healthcare provider. Document Revised: 11/30/2018 Document Reviewed: 10/06/2017 Elsevier Patient Education  2022 [employer].   Edwina Barth, MD [facility]

## 2020-09-25 ENCOUNTER — Telehealth: Payer: Self-pay

## 2020-09-25 NOTE — Telephone Encounter (Signed)
pts husband states the pt is asking for some pain medications for her hip and leg as she was seen on 09/13/2020 for this matter and is in a lot of pain. Pt has stated the Meloxicam that was rx'd is not working for her pain and she is in a lot of discomfort.

## 2020-09-28 NOTE — Telephone Encounter (Signed)
Has she also been taking the tramadol that I prescribed for her?  Please find out.  Thanks.

## 2020-10-06 NOTE — Telephone Encounter (Signed)
Called and left a vm to regarding how the patient was feeling and if see received Tramadol prescription.

## 2020-10-23 ENCOUNTER — Encounter: Payer: Self-pay | Admitting: Emergency Medicine

## 2020-10-23 ENCOUNTER — Other Ambulatory Visit: Payer: Self-pay | Admitting: Emergency Medicine

## 2020-10-23 ENCOUNTER — Ambulatory Visit: Payer: BC Managed Care – PPO | Admitting: Emergency Medicine

## 2020-10-23 ENCOUNTER — Other Ambulatory Visit: Payer: Self-pay

## 2020-10-23 VITALS — BP 136/72 | HR 86 | Temp 98.3°F | Ht 64.0 in | Wt 149.0 lb

## 2020-10-23 DIAGNOSIS — F4010 Social phobia, unspecified: Secondary | ICD-10-CM

## 2020-10-23 DIAGNOSIS — I10 Essential (primary) hypertension: Secondary | ICD-10-CM | POA: Diagnosis not present

## 2020-10-23 NOTE — Progress Notes (Signed)
[name] 75 y.o.   Chief Complaint  Patient presents with   Hypertension    Follow up    HISTORY OF PRESENT ILLNESS: This is a 75 y.o. female with history of hypertension on Hyzaar 50-12.5 mg daily here for follow-up. Recently had bout of left hip bursitis/sciatica.  Saw orthopedist.  Had MRI of lumbar spine done.  Doing much better.  Asymptomatic. Has no complaints or medical concerns today.  Hypertension Pertinent negatives include no chest pain, headaches, palpitations or shortness of breath.    Prior to Admission medications   Medication Sig Start Date End Date Taking? Authorizing Provider  cyclobenzaprine (FLEXERIL) 5 MG tablet Take 1 tablet (5 mg total) by mouth at bedtime as needed for muscle spasms. Do not drink alcohol or drive while taking this medication.  May cause drowsiness. 09/10/20   Particia Nearing, PA-C  EPINEPHrine 0.3 mg/0.3 mL IJ SOAJ injection Inject 0.3 mLs (0.3 mg total) into the muscle as needed for up to 1 dose for anaphylaxis. 09/16/19   Claude Manges, PA-C  fluticasone (FLONASE) 50 MCG/ACT nasal spray Place 1 spray into both nostrils daily. 02/26/20   Hall-Potvin, [place], PA-C  losartan-hydrochlorothiazide (HYZAAR) 50-12.5 MG tablet Take 1 tablet by mouth daily. 06/26/20 09/24/20  Georgina Quint, MD  meloxicam (MOBIC) 15 MG tablet Take 1 tablet (15 mg total) by mouth daily. 09/13/20   Georgina Quint, MD  Misc Natural Products (LUTEIN 20) CAPS Take by mouth at bedtime.    [provider]  predniSONE (DELTASONE) 20 MG tablet Take 2 tablets (40 mg total) by mouth daily with breakfast. 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    Past Medical History:  Diagnosis Date   Cystocele with prolapse    VAULT PROLAPSE   History of kidney stones    Macular degeneration BOTH EYES    Past Surgical History:  Procedure Laterality Date   BREAST ENHANCEMENT SURGERY  2007   CYSTOCELE REPAIR N/A 12/09/2017   Procedure: ANTERIOR REPAIR (CYSTOCELE);  Surgeon: Alfredo Martinez, MD;  Location: [facility];  Service: Urology;  Laterality: N/A;   CYSTOSCOPY N/A 12/09/2017   Procedure: CYSTOSCOPY;  Surgeon: Alfredo Martinez, MD;  Location: [facility];  Service: Urology;  Laterality: N/A;   NECK MASS EXCISION  2012   TUBAL LIGATION  1984   VAGINAL HYSTERECTOMY  2007   PARTIAL, AND BLADDER TACH DONE   VAGINAL PROLAPSE REPAIR N/A 12/09/2017   Procedure: VAGINAL VAULT PROLAPSE REPAIR WITH GRAFT;  Surgeon: Alfredo Martinez, MD;  Location: [facility];  Service: Urology;  Laterality: N/A;    Social History   Socioeconomic History   Marital status: Married    Spouse name: Not on file   Number of children: Not on file   Years of education: Not on file   Highest education level: Not on file  Occupational History   Not on file  Tobacco Use   Smoking status: Never   Smokeless tobacco: Never  Vaping Use   Vaping Use: Never used  Substance and Sexual Activity   Alcohol use:  No   Drug use: No   Sexual activity: Not on file  Other Topics Concern   Not on file  Social History Narrative   Not on file   Social Determinants of Health   Financial Resource Strain: Not on file  Food Insecurity: Not on file  Transportation Needs: Not on file  Physical Activity: Not on file  Stress: Not on file  Social Connections: Not on file  Intimate Partner Violence: Not on file    Family History  Problem Relation Age of Onset   Stroke Father    Colon cancer Neg Hx      Review of Systems  Constitutional: Negative.  Negative for chills and fever.  HENT:  Negative.  Negative for congestion and sore throat.   Respiratory: Negative.  Negative for cough and shortness of breath.   Cardiovascular: Negative.  Negative for chest pain and palpitations.  Gastrointestinal:  Negative for abdominal pain, diarrhea, nausea and vomiting.  Genitourinary: Negative.  Negative for dysuria and hematuria.  Skin: Negative.  Negative for rash.  Neurological:  Negative for dizziness and headaches.  All other systems reviewed and are negative.  Today's Vitals   10/23/20 1025  BP: 136/72  Pulse: 86  Temp: 98.3 F (36.8 C)  TempSrc: Oral  SpO2: 98%  Weight: 149 lb (67.6 kg)  Height:  (1.626 m)   Body mass index is 25.58 kg/m. Wt Readings from Last 3 Encounters:  10/23/20 149 lb (67.6 kg)  09/13/20 151 lb 6.4 oz (68.7 kg)  04/18/20 152 lb (68.9 kg)    Physical Exam Vitals reviewed.  Constitutional:      Appearance: Normal appearance.  HENT:     Head: Normocephalic.  Eyes:     Extraocular Movements: Extraocular movements intact.     Pupils: Pupils are equal, round, and reactive to light.  Cardiovascular:     Rate and Rhythm: Normal rate and regular rhythm.     Pulses: Normal pulses.     Heart sounds: Normal heart sounds.  Pulmonary:     Effort: Pulmonary effort is normal.     Breath sounds: Normal breath sounds.  Musculoskeletal:        General: Normal range of motion.     Cervical back: Normal range of motion and neck supple.  Skin:    General: Skin is warm and dry.     Capillary Refill: Capillary refill takes less than 2 seconds.  Neurological:     General: No focal deficit present.     Mental Status: She is alert and oriented to person, place, and time.  Psychiatric:        Mood and Affect: Mood normal.        Behavior: Behavior normal.     ASSESSMENT & PLAN: Essential hypertension Well-controlled hypertension.  Continue Hyzaar 50-12.5 mg daily. Dietary approaches to stop hypertension discussed. Follow-up in 6  months. Vaccine recommendations including pneumonia, shingles, and Tdap vaccines discussed with patient. Declines at present time.  [name] was seen today for hypertension.  Diagnoses and all orders for this visit:  Essential hypertension  Patient Instructions  Hypertension, Adult High blood pressure (hypertension) is when the force of blood pumping through the arteries is too strong. The arteries are the blood vessels that carry blood from the heart throughout the body. Hypertension forces the heart to work harder to pump blood and may cause arteries to become narrow or stiff. Untreated or uncontrolled hypertension can cause a heart attack, heart failure, a stroke,  kidney disease, and other problems. A blood pressure reading consists of a higher number over a lower number. Ideally, your blood pressure should be below 120/80. The first ("top") number is called the systolic pressure. It is a measure of the pressure in your arteries as your heart beats. The second ("bottom") number is called the diastolic pressure. It is a measure of the pressure in your arteries as the heart relaxes. What are the causes? The exact cause of this condition is not known. There are some conditions that result in or are related to high blood pressure. What increases the risk? Some risk factors for high blood pressure are under your control. The following factors may make you more likely to develop this condition: Smoking. Having type 2 diabetes mellitus, high cholesterol, or both. Not getting enough exercise or physical activity. Being overweight. Having too much fat, sugar, calories, or salt (sodium) in your diet. Drinking too much alcohol. Some risk factors for high blood pressure may be difficult or impossible to change. Some of these factors include: Having chronic kidney disease. Having a family history of high blood pressure. Age. Risk increases with age. Race. You may be at higher risk if you are African  American. Gender. Men are at higher risk than women before age 73. After age 73, women are at higher risk than men. Having obstructive sleep apnea. Stress. What are the signs or symptoms? High blood pressure may not cause symptoms. Very high blood pressure (hypertensive crisis) may cause: Headache. Anxiety. Shortness of breath. Nosebleed. Nausea and vomiting. Vision changes. Severe chest pain. Seizures. How is this diagnosed? This condition is diagnosed by measuring your blood pressure while you are seated, with your arm resting on a flat surface, your legs uncrossed, and your feet flat on the floor. The cuff of the blood pressure monitor will be placed directly against the skin of your upper arm at the level of your heart. It should be measured at least twice using the same arm. Certain conditions can cause a difference in blood pressure between your right and left arms. Certain factors can cause blood pressure readings to be lower or higher than normal for a short period of time: When your blood pressure is higher when you are in a health care provider's office than when you are at home, this is called white coat hypertension. Most people with this condition do not need medicines. When your blood pressure is higher at home than when you are in a health care provider's office, this is called masked hypertension. Most people with this condition may need medicines to control blood pressure. If you have a high blood pressure reading during one visit or you have normal blood pressure with other risk factors, you may be asked to: Return on a different day to have your blood pressure checked again. Monitor your blood pressure at home for 1 week or longer. If you are diagnosed with hypertension, you may have other blood or imaging tests to help your health care provider understand your overall risk for other conditions. How is this treated? This condition is treated by making healthy lifestyle  changes, such as eating healthy foods, exercising more, and reducing your alcohol intake. Your health care provider may prescribe medicine if lifestyle changes are not enough to get your blood pressure under control, and if: Your systolic blood pressure is above 130. Your diastolic blood pressure is above 80. Your personal target blood pressure may vary depending on your medical conditions, your age,  and other factors. Follow these instructions at home: Eating and drinking  Eat a diet that is high in fiber and potassium, and low in sodium, added sugar, and fat. An example eating plan is called the DASH (Dietary Approaches to Stop Hypertension) diet. To eat this way: Eat plenty of fresh fruits and vegetables. Try to fill one half of your plate at each meal with fruits and vegetables. Eat whole grains, such as whole-wheat pasta, brown rice, or whole-grain bread. Fill about one fourth of your plate with whole grains. Eat or drink low-fat dairy products, such as skim milk or low-fat yogurt. Avoid fatty cuts of meat, processed or cured meats, and poultry with skin. Fill about one fourth of your plate with lean proteins, such as fish, chicken without skin, beans, eggs, or tofu. Avoid pre-made and processed foods. These tend to be higher in sodium, added sugar, and fat. Reduce your daily sodium intake. Most people with hypertension should eat less than 1,500 mg of sodium a day. Do not drink alcohol if: Your health care provider tells you not to drink. You are pregnant, may be pregnant, or are planning to become pregnant. If you drink alcohol: Limit how much you use to: 0-1 drink a day for women. 0-2 drinks a day for men. Be aware of how much alcohol is in your drink. In the [place] one drink equals one 12 oz bottle of beer (355 mL), one 5 oz glass of wine (148 mL), or one 1 oz glass of hard liquor (44 mL). Lifestyle  Work with your health care provider to maintain a healthy body weight or to lose  weight. Ask what an ideal weight is for you. Get at least 30 minutes of exercise most days of the week. Activities may include walking, swimming, or biking. Include exercise to strengthen your muscles (resistance exercise), such as Pilates or lifting weights, as part of your weekly exercise routine. Try to do these types of exercises for 30 minutes at least 3 days a week. Do not use any products that contain nicotine or tobacco, such as cigarettes, e-cigarettes, and chewing tobacco. If you need help quitting, ask your health care provider. Monitor your blood pressure at home as told by your health care provider. Keep all follow-up visits as told by your health care provider. This is important. Medicines Take over-the-counter and prescription medicines only as told by your health care provider. Follow directions carefully. Blood pressure medicines must be taken as prescribed. Do not skip doses of blood pressure medicine. Doing this puts you at risk for problems and can make the medicine less effective. Ask your health care provider about side effects or reactions to medicines that you should watch for. Contact a health care provider if you: Think you are having a reaction to a medicine you are taking. Have headaches that keep coming back (recurring). Feel dizzy. Have swelling in your ankles. Have trouble with your vision. Get help right away if you: Develop a severe headache or confusion. Have unusual weakness or numbness. Feel faint. Have severe pain in your chest or abdomen. Vomit repeatedly. Have trouble breathing. Summary Hypertension is when the force of blood pumping through your arteries is too strong. If this condition is not controlled, it may put you at risk for serious complications. Your personal target blood pressure may vary depending on your medical conditions, your age, and other factors. For most people, a normal blood pressure is less than 120/80. Hypertension is treated  with lifestyle changes,  medicines, or a combination of both. Lifestyle changes include losing weight, eating a healthy, low-sodium diet, exercising more, and limiting alcohol. This information is not intended to replace advice given to you by your health care provider. Make sure you discuss any questions you have with your health care provider. Document Revised: 10/08/2017 Document Reviewed: 10/08/2017 Elsevier Patient Education  2022 Elsevier Inc.    Edwina Barth, MD [facility]

## 2020-10-23 NOTE — Assessment & Plan Note (Signed)
Well-controlled hypertension.  Continue Hyzaar 50-12.5 mg daily. Dietary approaches to stop hypertension discussed. Follow-up in 6 months.

## 2020-10-23 NOTE — Patient Instructions (Signed)

## 2021-04-23 ENCOUNTER — Ambulatory Visit: Payer: BC Managed Care – PPO | Admitting: Emergency Medicine

## 2021-04-23 ENCOUNTER — Encounter: Payer: Self-pay | Admitting: Emergency Medicine

## 2021-04-23 ENCOUNTER — Other Ambulatory Visit: Payer: Self-pay

## 2021-04-23 VITALS — BP 110/60 | HR 87 | Temp 97.9°F | Ht 64.0 in | Wt 154.0 lb

## 2021-04-23 DIAGNOSIS — I10 Essential (primary) hypertension: Secondary | ICD-10-CM

## 2021-04-23 LAB — LIPID PANEL
Cholesterol: 184 mg/dL (ref 0–200)
HDL: 49.8 mg/dL (ref >39.00)
LDL Cholesterol: 103 mg/dL — ABNORMAL HIGH (ref 0–99)
NonHDL: 134.63
Total CHOL/HDL Ratio: 4
Triglycerides: 160 mg/dL — ABNORMAL HIGH (ref 0.0–149.0)
VLDL: 32 mg/dL (ref 0.0–40.0)

## 2021-04-23 LAB — HEMOGLOBIN A1C: Hgb A1c MFr Bld: 5.8 % (ref 4.6–6.5)

## 2021-04-23 LAB — COMPREHENSIVE METABOLIC PANEL
ALT: 14 U/L (ref 0–35)
AST: 36 U/L (ref 0–37)
Albumin: 4.3 g/dL (ref 3.5–5.2)
Alkaline Phosphatase: 56 U/L (ref 39–117)
BUN: 20 mg/dL (ref 6–23)
CO2: 27 mEq/L (ref 19–32)
Calcium: 9.6 mg/dL (ref 8.4–10.5)
Chloride: 101 mEq/L (ref 96–112)
Creatinine, Ser: 0.85 mg/dL (ref 0.40–1.20)
GFR: 68.03 mL/min (ref >60.00)
Glucose, Bld: 114 mg/dL — ABNORMAL HIGH (ref 70–99)
Potassium: 4.1 mEq/L (ref 3.5–5.1)
Sodium: 137 mEq/L (ref 135–145)
Total Bilirubin: 0.5 mg/dL (ref 0.2–1.2)
Total Protein: 6.9 g/dL (ref 6.0–8.3)

## 2021-04-23 NOTE — Assessment & Plan Note (Signed)
Well-controlled hypertension.  Continue Hyzaar 50-12.5 mg daily. Follow-up in 6 months. 

## 2021-04-23 NOTE — Patient Instructions (Signed)
Health Maintenance After Age 74 After age 74, you are at a higher risk for certain long-term diseases and infections as well as injuries from falls. Falls are a major cause of broken bones and head injuries in people who are older than age 74. Getting regular preventive care can help to keep you healthy and well. Preventive care includes getting regular testing and making lifestyle changes as recommended by your health care provider. Talk with your health care provider about: Which screenings and tests you should have. A screening is a test that checks for a disease when you have no symptoms. A diet and exercise plan that is right for you. What should I know about screenings and tests to prevent falls? Screening and testing are the best ways to find a health problem early. Early diagnosis and treatment give you the best chance of managing medical conditions that are common after age 74. Certain conditions and lifestyle choices may make you more likely to have a fall. Your health care provider may recommend: Regular vision checks. Poor vision and conditions such as cataracts can make you more likely to have a fall. If you wear glasses, make sure to get your prescription updated if your vision changes. Medicine review. Work with your health care provider to regularly review all of the medicines you are taking, including over-the-counter medicines. Ask your health care provider about any side effects that may make you more likely to have a fall. Tell your health care provider if any medicines that you take make you feel dizzy or sleepy. Strength and balance checks. Your health care provider may recommend certain tests to check your strength and balance while standing, walking, or changing positions. Foot health exam. Foot pain and numbness, as well as not wearing proper footwear, can make you more likely to have a fall. Screenings, including: Osteoporosis screening. Osteoporosis is a condition that causes  the bones to get weaker and break more easily. Blood pressure screening. Blood pressure changes and medicines to control blood pressure can make you feel dizzy. Depression screening. You may be more likely to have a fall if you have a fear of falling, feel depressed, or feel unable to do activities that you used to do. Alcohol use screening. Using too much alcohol can affect your balance and may make you more likely to have a fall. Follow these instructions at home: Lifestyle Do not drink alcohol if: Your health care provider tells you not to drink. If you drink alcohol: Limit how much you have to: 0-1 drink a day for women. 0-2 drinks a day for men. Know how much alcohol is in your drink. In the [place] one drink equals one 12 oz bottle of beer (355 mL), one 5 oz glass of wine (148 mL), or one 1 oz glass of hard liquor (44 mL). Do not use any products that contain nicotine or tobacco. These products include cigarettes, chewing tobacco, and vaping devices, such as e-cigarettes. If you need help quitting, ask your health care provider. Activity  Follow a regular exercise program to stay fit. This will help you maintain your balance. Ask your health care provider what types of exercise are appropriate for you. If you need a cane or walker, use it as recommended by your health care provider. Wear supportive shoes that have nonskid soles. Safety  Remove any tripping hazards, such as rugs, cords, and clutter. Install safety equipment such as grab bars in bathrooms and safety rails on stairs. Keep rooms and walkways   well-lit. General instructions Talk with your health care provider about your risks for falling. Tell your health care provider if: You fall. Be sure to tell your health care provider about all falls, even ones that seem minor. You feel dizzy, tiredness (fatigue), or off-balance. Take over-the-counter and prescription medicines only as told by your health care provider. These include  supplements. Eat a healthy diet and maintain a healthy weight. A healthy diet includes low-fat dairy products, low-fat (lean) meats, and fiber from whole grains, beans, and lots of fruits and vegetables. Stay current with your vaccines. Schedule regular health, dental, and eye exams. Summary Having a healthy lifestyle and getting preventive care can help to protect your health and wellness after age 74. Screening and testing are the best way to find a health problem early and help you avoid having a fall. Early diagnosis and treatment give you the best chance for managing medical conditions that are more common for people who are older than age 74. Falls are a major cause of broken bones and head injuries in people who are older than age 74. Take precautions to prevent a fall at home. Work with your health care provider to learn what changes you can make to improve your health and wellness and to prevent falls. This information is not intended to replace advice given to you by your health care provider. Make sure you discuss any questions you have with your health care provider. Document Revised: 06/19/2020 Document Reviewed: 06/19/2020 Elsevier Patient Education  2022 [employer].  

## 2021-04-23 NOTE — Progress Notes (Signed)
[name] 76 y.o.   Chief Complaint  Patient presents with   Hypertension    F/u    HISTORY OF PRESENT ILLNESS: This is a 76 y.o. female with history of hypertension here for 71-month follow-up. Presently on Hyzaar 50-12.5 mg daily.  Doing well. Has no complaints or medical concerns today.  Hypertension Pertinent negatives include no chest pain, headaches, palpitations or shortness of breath.    Prior to Admission medications   Medication Sig Start Date End Date Taking? Authorizing Provider  EPINEPHrine 0.3 mg/0.3 mL IJ SOAJ injection Inject 0.3 mLs (0.3 mg total) into the muscle as needed for up to 1 dose for anaphylaxis. 09/16/19  Yes Soto, Beverley Fiedler, PA-C  losartan-hydrochlorothiazide (HYZAAR) 50-12.5 MG tablet Take 1 tablet by mouth daily. 06/26/20 04/23/21 Yes Horald Pollen, MD  sertraline (ZOLOFT) 25 MG tablet TAKE 1 TABLET BY MOUTH EVERY DAY 10/23/20  Yes Zayvier Caravello, Ines Bloomer, MD  tobramycin-dexamethasone [facility]) ophthalmic solution SMARTSIG:In Eye(s) 03/07/21  Yes [provider]  fluticasone (FLONASE) 50 MCG/ACT nasal spray Place 1 spray into both nostrils daily. 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    Past Medical History:  Diagnosis Date   Cystocele with prolapse    VAULT PROLAPSE   History of kidney stones    Macular degeneration BOTH EYES    Past Surgical History:  Procedure Laterality Date   BREAST ENHANCEMENT SURGERY  2007   CYSTOCELE REPAIR N/A 12/09/2017   Procedure: ANTERIOR REPAIR (CYSTOCELE);  Surgeon: Bjorn Loser, MD;  Location: [facility];  Service: Urology;  Laterality: N/A;   CYSTOSCOPY N/A  12/09/2017   Procedure: CYSTOSCOPY;  Surgeon: Bjorn Loser, MD;  Location: [facility];  Service: Urology;  Laterality: N/A;   NECK MASS EXCISION  2012   TUBAL LIGATION  1984   VAGINAL HYSTERECTOMY  2007   PARTIAL, AND BLADDER TACH DONE   VAGINAL PROLAPSE REPAIR N/A 12/09/2017   Procedure: VAGINAL VAULT PROLAPSE REPAIR WITH GRAFT;  Surgeon: Bjorn Loser, MD;  Location: [facility];  Service: Urology;  Laterality: N/A;    Social History   Socioeconomic History   Marital status: Married    Spouse name: Not on file   Number of children: Not on file   Years of education: Not on file   Highest education level: Not on file  Occupational History   Not on file  Tobacco Use   Smoking status: Never   Smokeless tobacco: Never  Vaping Use   Vaping Use: Never used  Substance and Sexual Activity   Alcohol use: No   Drug use: No   Sexual activity: Not on file  Other Topics Concern   Not on file  Social History Narrative   Not on file   Social Determinants of Health   Financial Resource Strain: Not on file  Food Insecurity: Not on file  Transportation Needs: Not on file  Physical Activity: Not on file  Stress: Not on file  Social Connections: Not on file  Intimate Partner Violence: Not on file    Family History  Problem Relation Age of Onset   Stroke Father    Colon cancer Neg Hx  Review of Systems  Constitutional: Negative.  Negative for chills and fever.  HENT: Negative.  Negative for congestion and sore throat.   Respiratory: Negative.  Negative for cough and shortness of breath.   Cardiovascular: Negative.  Negative for chest pain and palpitations.  Gastrointestinal: Negative.  Negative for abdominal pain, diarrhea, nausea and vomiting.  Genitourinary: Negative.  Negative for hematuria.  Skin: Negative.  Negative for rash.  Neurological: Negative.  Negative for dizziness and headaches.  All other systems reviewed and are negative.  Today's Vitals   04/23/21  1045  BP: 110/60  Pulse: 87  Temp: 97.9 F (36.6 C)  TempSrc: Oral  SpO2: 93%  Weight: 154 lb (69.9 kg)  Height: 5\' 4"  (1.626 m)   Body mass index is 26.43 kg/m.  Physical Exam Vitals reviewed.  Constitutional:      Appearance: Normal appearance.  HENT:     Head: Normocephalic.  Eyes:     Extraocular Movements: Extraocular movements intact.     Pupils: Pupils are equal, round, and reactive to light.  Cardiovascular:     Rate and Rhythm: Normal rate and regular rhythm.     Pulses: Normal pulses.     Heart sounds: Normal heart sounds.  Pulmonary:     Effort: Pulmonary effort is normal.     Breath sounds: Normal breath sounds.  Musculoskeletal:     Cervical back: No tenderness.     Right lower leg: No edema.     Left lower leg: No edema.  Lymphadenopathy:     Cervical: No cervical adenopathy.  Skin:    General: Skin is warm and dry.  Neurological:     General: No focal deficit present.     Mental Status: She is alert and oriented to person, place, and time.  Psychiatric:        Mood and Affect: Mood normal.        Behavior: Behavior normal.     ASSESSMENT & PLAN: A total of 30 minutes was spent with the patient and counseling/coordination of care regarding preparing for this visit, review of most recent office visit notes, review of most recent blood work results, review of all medications, cardiovascular risks associated with hypertension, education on nutrition, prognosis, documentation and need for follow-up.  Problem List Items Addressed This Visit       Cardiovascular and Mediastinum   Hypertensive disorder    Well-controlled hypertension. Continue Hyzaar 50-12.5 mg daily. Follow-up in 6 months.      Other Visit Diagnoses     Essential hypertension    -  Primary   Relevant Orders   Lipid panel   Comprehensive metabolic panel   Hemoglobin A1c      Patient Instructions  Health Maintenance After Age 59 After age 75, you are at a higher risk for  certain long-term diseases and infections as well as injuries from falls. Falls are a major cause of broken bones and head injuries in people who are older than age 6. Getting regular preventive care can help to keep you healthy and well. Preventive care includes getting regular testing and making lifestyle changes as recommended by your health care provider. Talk with your health care provider about: Which screenings and tests you should have. A screening is a test that checks for a disease when you have no symptoms. A diet and exercise plan that is right for you. What should I know about screenings and tests to prevent falls? Screening and testing are the best ways  to find a health problem early. Early diagnosis and treatment give you the best chance of managing medical conditions that are common after age 74. Certain conditions and lifestyle choices may make you more likely to have a fall. Your health care provider may recommend: Regular vision checks. Poor vision and conditions such as cataracts can make you more likely to have a fall. If you wear glasses, make sure to get your prescription updated if your vision changes. Medicine review. Work with your health care provider to regularly review all of the medicines you are taking, including over-the-counter medicines. Ask your health care provider about any side effects that may make you more likely to have a fall. Tell your health care provider if any medicines that you take make you feel dizzy or sleepy. Strength and balance checks. Your health care provider may recommend certain tests to check your strength and balance while standing, walking, or changing positions. Foot health exam. Foot pain and numbness, as well as not wearing proper footwear, can make you more likely to have a fall. Screenings, including: Osteoporosis screening. Osteoporosis is a condition that causes the bones to get weaker and break more easily. Blood pressure screening.  Blood pressure changes and medicines to control blood pressure can make you feel dizzy. Depression screening. You may be more likely to have a fall if you have a fear of falling, feel depressed, or feel unable to do activities that you used to do. Alcohol use screening. Using too much alcohol can affect your balance and may make you more likely to have a fall. Follow these instructions at home: Lifestyle Do not drink alcohol if: Your health care provider tells you not to drink. If you drink alcohol: Limit how much you have to: 0-1 drink a day for women. 0-2 drinks a day for men. Know how much alcohol is in your drink. In the [place] one drink equals one 12 oz bottle of beer (355 mL), one 5 oz glass of wine (148 mL), or one 1 oz glass of hard liquor (44 mL). Do not use any products that contain nicotine or tobacco. These products include cigarettes, chewing tobacco, and vaping devices, such as e-cigarettes. If you need help quitting, ask your health care provider. Activity  Follow a regular exercise program to stay fit. This will help you maintain your balance. Ask your health care provider what types of exercise are appropriate for you. If you need a cane or walker, use it as recommended by your health care provider. Wear supportive shoes that have nonskid soles. Safety  Remove any tripping hazards, such as rugs, cords, and clutter. Install safety equipment such as grab bars in bathrooms and safety rails on stairs. Keep rooms and walkways well-lit. General instructions Talk with your health care provider about your risks for falling. Tell your health care provider if: You fall. Be sure to tell your health care provider about all falls, even ones that seem minor. You feel dizzy, tiredness (fatigue), or off-balance. Take over-the-counter and prescription medicines only as told by your health care provider. These include supplements. Eat a healthy diet and maintain a healthy weight. A healthy  diet includes low-fat dairy products, low-fat (lean) meats, and fiber from whole grains, beans, and lots of fruits and vegetables. Stay current with your vaccines. Schedule regular health, dental, and eye exams. Summary Having a healthy lifestyle and getting preventive care can help to protect your health and wellness after age 79. Screening and testing are the best  way to find a health problem early and help you avoid having a fall. Early diagnosis and treatment give you the best chance for managing medical conditions that are more common for people who are older than age 33. Falls are a major cause of broken bones and head injuries in people who are older than age 59. Take precautions to prevent a fall at home. Work with your health care provider to learn what changes you can make to improve your health and wellness and to prevent falls. This information is not intended to replace advice given to you by your health care provider. Make sure you discuss any questions you have with your health care provider. Document Revised: 06/19/2020 Document Reviewed: 06/19/2020 Elsevier Patient Education  2022 [place], MD [facility]

## 2021-06-15 ENCOUNTER — Other Ambulatory Visit: Payer: Self-pay | Admitting: Emergency Medicine

## 2021-06-15 DIAGNOSIS — I10 Essential (primary) hypertension: Secondary | ICD-10-CM

## 2021-07-28 ENCOUNTER — Ambulatory Visit
Admission: EM | Admit: 2021-07-28 | Discharge: 2021-07-28 | Disposition: A | Discharge status: Home / Self Care | Payer: BC Managed Care – PPO | Attending: Internal Medicine | Admitting: Internal Medicine

## 2021-07-28 DIAGNOSIS — T7840XA Allergy, unspecified, initial encounter: Secondary | ICD-10-CM

## 2021-07-28 DIAGNOSIS — T63461A Toxic effect of venom of wasps, accidental (unintentional), initial encounter: Secondary | ICD-10-CM | POA: Diagnosis not present

## 2021-07-28 MED ORDER — DEXAMETHASONE SODIUM PHOSPHATE 10 MG/ML IJ SOLN
10.0000 mg | Freq: Once | INTRAMUSCULAR | Status: AC
  Administered 2021-07-28: 10 mg via INTRAMUSCULAR

## 2021-07-28 NOTE — ED Triage Notes (Signed)
Pt states she got stung by a bee today. Pt denies difficulty breathing or swallowing. Pt states she was stung on her left arm . Pt states her left arm is hot and tender. 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

## 2021-07-28 NOTE — Discharge Instructions (Signed)
You were given a steroid shot in urgent care today to help alleviate allergic reaction.  May also use cool compresses to help alleviate discomfort.  You may continue antihistamines as well.  Please follow-up if symptoms persist or worsen.

## 2021-07-28 NOTE — ED Provider Notes (Signed)
[facility]    CSN: [id number] Arrival date & time: 07/28/21  1204      History   Chief Complaint Chief Complaint  Patient presents with   Insect Bite    HPI [name] is a 76 y.o. female.   Patient reports that she was stung by wasp on her left forearm at about 10:30 AM today.  Patient ss concerned due to increased swelling and redness that is present throughout forearm.  Denies any difficulty breathing or any feelings of throat swelling.  Patient has taken a Benadryl this morning with minimal improvement.  Denies fever, body aches, chills.     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    Past Surgical History:  Procedure Laterality Date   BREAST ENHANCEMENT SURGERY  2007   CYSTOCELE REPAIR N/A 12/09/2017   Procedure: ANTERIOR REPAIR (CYSTOCELE);  Surgeon: Alfredo Martinez, MD;  Location: [facility];  Service: Urology;  Laterality: N/A;   CYSTOSCOPY N/A 12/09/2017   Procedure: CYSTOSCOPY;  Surgeon: Alfredo Martinez, MD;  Location: [facility];  Service: Urology;  Laterality: N/A;   NECK MASS EXCISION  2012   TUBAL LIGATION  1984   VAGINAL HYSTERECTOMY  2007   PARTIAL, AND BLADDER TACH DONE   VAGINAL PROLAPSE REPAIR N/A 12/09/2017   Procedure: VAGINAL VAULT PROLAPSE REPAIR WITH GRAFT;  Surgeon: Alfredo Martinez, MD;  Location: [facility];  Service: Urology;  Laterality: N/A;    OB History   No obstetric history on file.      Home Medications    Prior to Admission medications   Medication Sig Start Date End Date Taking? Authorizing Provider  EPINEPHrine 0.3 mg/0.3 mL IJ SOAJ injection Inject 0.3 mLs (0.3 mg total) into the muscle as  needed for up to 1 dose for anaphylaxis. 09/16/19   Claude Manges, PA-C  fluticasone (FLONASE) 50 MCG/ACT nasal spray Place 1 spray into both nostrils daily. Patient not taking: Reported on 10/23/2020 02/26/20   Hall-Potvin, [place], PA-C  losartan-hydrochlorothiazide (HYZAAR) 50-12.5 MG tablet TAKE 1 TABLET BY MOUTH EVERY DAY 06/15/21   Georgina Quint, MD  sertraline (ZOLOFT) 25 MG tablet TAKE 1 TABLET BY MOUTH EVERY DAY 10/23/20   Georgina Quint, MD  tobramycin-dexamethasone [facility]) ophthalmic solution SMARTSIG:In Eye(s) 03/07/21   [provider]    Family History Family History  Problem Relation Age of Onset   Stroke Father    Colon cancer Neg Hx     Social History Social History   Tobacco Use   Smoking status: Never   Smokeless tobacco: Never  Vaping Use   Vaping Use: Never used  Substance Use Topics   Alcohol use: No   Drug use: No     Allergies   Yellow jacket venom [bee venom]   Review of Systems Review of Systems Per HPI  Physical Exam Triage Vital Signs ED Triage Vitals  Enc Vitals Group     BP 07/28/21 1329 139/77     Pulse Rate 07/28/21 1329 62     Resp 07/28/21 1329 18     Temp 07/28/21 1329 98.1 F (36.7 C)     Temp Source 07/28/21  1329 Oral     SpO2 07/28/21 1329 94 %     Weight --      Height --      Head Circumference --      Peak Flow --      Pain Score 07/28/21 1327 2     Pain Loc --      Pain Edu? --      Excl. in GC? --    No data found.  Updated Vital Signs BP 139/77 (BP Location: Right Arm)   Pulse 62   Temp 98.1 F (36.7 C) (Oral)   Resp 18   SpO2 94%   Visual Acuity Right Eye Distance:   Left Eye Distance:   Bilateral Distance:    Right Eye Near:   Left Eye Near:    Bilateral Near:     Physical Exam Constitutional:      General: She is not in acute distress.    Appearance: Normal appearance. She is not toxic-appearing or diaphoretic.  HENT:     Head: Normocephalic and atraumatic.  Eyes:      Extraocular Movements: Extraocular movements intact.     Conjunctiva/sclera: Conjunctivae normal.  Pulmonary:     Effort: Pulmonary effort is normal.  Skin:    Comments: Mild swelling and erythema to left forearm.  Mild warmth noted.  No obvious purulent drainage noted.  Patient has full range of motion of upper extremity.  Grip strength 5/5.  Neurovascular intact.  Neurological:     General: No focal deficit present.     Mental Status: She is alert and oriented to person, place, and time. Mental status is at baseline.  Psychiatric:        Mood and Affect: Mood normal.        Behavior: Behavior normal.        Thought Content: Thought content normal.        Judgment: Judgment normal.      UC Treatments / Results  Labs (all labs ordered are listed, but only abnormal results are displayed) Labs Reviewed - No data to display  EKG   Radiology No results found.  Procedures Procedures (including critical care time)  Medications Ordered in UC Medications  dexamethasone (DECADRON) injection 10 mg (10 mg Intramuscular Given 07/28/21 1400)    Initial Impression / Assessment and Plan / UC Course  I have reviewed the triage vital signs and the nursing notes.  Pertinent labs & imaging results that were available during my care of the patient were reviewed by me and considered in my medical decision making (see chart for details).     Localized allergic reaction to left forearm from wasp sting.  Symptoms are refractory to antihistamines.  Will treat with IM Decadron.  There does not appear to be any obvious contraindications to Decadron at this time.  discussed safe antihistamines with patient as well that she may take to supplement.  Discussed cool compresses to affected area to help alleviate discomfort.  Discussed return and ER precautions.  Patient verbalized understanding and was agreeable with plan. Final Clinical Impressions(s) / UC Diagnoses   Final diagnoses:  Wasp sting,  accidental or unintentional, initial encounter  Allergic reaction, initial encounter     Discharge Instructions      You were given a steroid shot in urgent care today to help alleviate allergic reaction.  May also use cool compresses to help alleviate discomfort.  You may continue antihistamines as well.  Please follow-up if symptoms persist  or worsen.    ED Prescriptions   None    PDMP not reviewed this encounter.   Gustavus Bryant, [place] 07/28/21 [phone]

## 2021-09-17 ENCOUNTER — Other Ambulatory Visit: Payer: Self-pay | Admitting: Emergency Medicine

## 2021-09-17 DIAGNOSIS — F4010 Social phobia, unspecified: Secondary | ICD-10-CM

## 2021-10-29 ENCOUNTER — Encounter: Payer: Self-pay | Admitting: Emergency Medicine

## 2021-10-29 ENCOUNTER — Ambulatory Visit: Payer: BC Managed Care – PPO | Admitting: Emergency Medicine

## 2021-10-29 VITALS — BP 120/76 | HR 64 | Temp 98.2°F | Ht 64.5 in | Wt 156.0 lb

## 2021-10-29 DIAGNOSIS — I10 Essential (primary) hypertension: Secondary | ICD-10-CM

## 2021-10-29 NOTE — Assessment & Plan Note (Signed)
Well-controlled hypertension. BP Readings from Last 3 Encounters:  10/29/21 120/76  07/28/21 139/77  04/23/21 110/60  Continue Hyzaar 50-12.5 mg daily. Cardiovascular risks associated with hypertension discussed. Dietary approaches to stop hypertension discussed. Follow-up in 6 months.

## 2021-10-29 NOTE — Patient Instructions (Signed)
Health Maintenance After Age 74 After age 74, you are at a higher risk for certain long-term diseases and infections as well as injuries from falls. Falls are a major cause of broken bones and head injuries in people who are older than age 74. Getting regular preventive care can help to keep you healthy and well. Preventive care includes getting regular testing and making lifestyle changes as recommended by your health care provider. Talk with your health care provider about: Which screenings and tests you should have. A screening is a test that checks for a disease when you have no symptoms. A diet and exercise plan that is right for you. What should I know about screenings and tests to prevent falls? Screening and testing are the best ways to find a health problem early. Early diagnosis and treatment give you the best chance of managing medical conditions that are common after age 74. Certain conditions and lifestyle choices may make you more likely to have a fall. Your health care provider may recommend: Regular vision checks. Poor vision and conditions such as cataracts can make you more likely to have a fall. If you wear glasses, make sure to get your prescription updated if your vision changes. Medicine review. Work with your health care provider to regularly review all of the medicines you are taking, including over-the-counter medicines. Ask your health care provider about any side effects that may make you more likely to have a fall. Tell your health care provider if any medicines that you take make you feel dizzy or sleepy. Strength and balance checks. Your health care provider may recommend certain tests to check your strength and balance while standing, walking, or changing positions. Foot health exam. Foot pain and numbness, as well as not wearing proper footwear, can make you more likely to have a fall. Screenings, including: Osteoporosis screening. Osteoporosis is a condition that causes  the bones to get weaker and break more easily. Blood pressure screening. Blood pressure changes and medicines to control blood pressure can make you feel dizzy. Depression screening. You may be more likely to have a fall if you have a fear of falling, feel depressed, or feel unable to do activities that you used to do. Alcohol use screening. Using too much alcohol can affect your balance and may make you more likely to have a fall. Follow these instructions at home: Lifestyle Do not drink alcohol if: Your health care provider tells you not to drink. If you drink alcohol: Limit how much you have to: 0-1 drink a day for women. 0-2 drinks a day for men. Know how much alcohol is in your drink. In the [place] one drink equals one 12 oz bottle of beer (355 mL), one 5 oz glass of wine (148 mL), or one 1 oz glass of hard liquor (44 mL). Do not use any products that contain nicotine or tobacco. These products include cigarettes, chewing tobacco, and vaping devices, such as e-cigarettes. If you need help quitting, ask your health care provider. Activity  Follow a regular exercise program to stay fit. This will help you maintain your balance. Ask your health care provider what types of exercise are appropriate for you. If you need a cane or walker, use it as recommended by your health care provider. Wear supportive shoes that have nonskid soles. Safety  Remove any tripping hazards, such as rugs, cords, and clutter. Install safety equipment such as grab bars in bathrooms and safety rails on stairs. Keep rooms and walkways   well-lit. General instructions Talk with your health care provider about your risks for falling. Tell your health care provider if: You fall. Be sure to tell your health care provider about all falls, even ones that seem minor. You feel dizzy, tiredness (fatigue), or off-balance. Take over-the-counter and prescription medicines only as told by your health care provider. These include  supplements. Eat a healthy diet and maintain a healthy weight. A healthy diet includes low-fat dairy products, low-fat (lean) meats, and fiber from whole grains, beans, and lots of fruits and vegetables. Stay current with your vaccines. Schedule regular health, dental, and eye exams. Summary Having a healthy lifestyle and getting preventive care can help to protect your health and wellness after age 74. Screening and testing are the best way to find a health problem early and help you avoid having a fall. Early diagnosis and treatment give you the best chance for managing medical conditions that are more common for people who are older than age 74. Falls are a major cause of broken bones and head injuries in people who are older than age 74. Take precautions to prevent a fall at home. Work with your health care provider to learn what changes you can make to improve your health and wellness and to prevent falls. This information is not intended to replace advice given to you by your health care provider. Make sure you discuss any questions you have with your health care provider. Document Revised: 06/19/2020 Document Reviewed: 06/19/2020 Elsevier Patient Education  2023 [employer] Inc[employer]  

## 2021-10-29 NOTE — Progress Notes (Signed)
[name] 76 y.o.   Chief Complaint  Patient presents with   Follow-up    90mnthf/u appt , no concerns     HISTORY OF PRESENT ILLNESS: This is a 76 y.o. female here for 74-month follow-up of chronic medical problems. History of hypertension.  Normal blood pressure readings at home. Doing well.  Has no complaints or medical concerns today. BP Readings from Last 3 Encounters:  10/29/21 120/76  07/28/21 139/77  04/23/21 110/60     HPI   Prior to Admission medications   Medication Sig Start Date End Date Taking? Authorizing Provider  EPINEPHrine 0.3 mg/0.3 mL IJ SOAJ injection Inject 0.3 mLs (0.3 mg total) into the muscle as needed for up to 1 dose for anaphylaxis. 09/16/19  Yes Soto, Beverley Fiedler, PA-C  losartan-hydrochlorothiazide (HYZAAR) 50-12.5 MG tablet TAKE 1 TABLET BY MOUTH EVERY DAY 06/15/21  Yes Horald Pollen, MD  sertraline (ZOLOFT) 25 MG tablet TAKE 1 TABLET BY MOUTH EVERY DAY 10/23/20  Yes Keriann Rankin, Ines Bloomer, MD  tobramycin-dexamethasone [facility]) ophthalmic solution SMARTSIG:In Eye(s) 03/07/21  Yes [provider]    Allergies  Allergen Reactions   Yellow Jacket Venom [Bee Venom] Rash    Patient Active Problem List   Diagnosis Date Noted   Hypertensive disorder 10/28/2018   Cystocele with prolapse 12/09/2017   SUI (stress urinary incontinence, female) 07/17/2016   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    Past Medical History:  Diagnosis Date   Cystocele with prolapse    VAULT PROLAPSE   History of kidney stones    Macular degeneration BOTH EYES    Past Surgical History:  Procedure Laterality Date   BREAST ENHANCEMENT SURGERY  2007   CYSTOCELE REPAIR N/A 12/09/2017   Procedure: ANTERIOR REPAIR (CYSTOCELE);  Surgeon: Bjorn Loser, MD;  Location: [facility];  Service: Urology;  Laterality: N/A;   CYSTOSCOPY N/A 12/09/2017   Procedure: CYSTOSCOPY;  Surgeon: Bjorn Loser, MD;  Location: [facility];  Service: Urology;  Laterality: N/A;   NECK  MASS EXCISION  2012   TUBAL LIGATION  1984   VAGINAL HYSTERECTOMY  2007   PARTIAL, AND BLADDER TACH DONE   VAGINAL PROLAPSE REPAIR N/A 12/09/2017   Procedure: VAGINAL VAULT PROLAPSE REPAIR WITH GRAFT;  Surgeon: Bjorn Loser, MD;  Location: [facility];  Service: Urology;  Laterality: N/A;    Social History   Socioeconomic History   Marital status: Married    Spouse name: Not on file   Number of children: Not on file   Years of education: Not on file   Highest education level: Not on file  Occupational History   Not on file  Tobacco Use   Smoking status: Never   Smokeless tobacco: Never  Vaping Use   Vaping Use: Never used  Substance and Sexual Activity   Alcohol use: No   Drug use: No   Sexual activity: Not on file  Other Topics Concern   Not on file  Social History Narrative   Not on file   Social Determinants of Health   Financial Resource Strain: Not on file  Food Insecurity: Not on file  Transportation Needs: Not on file  Physical Activity: Not on file  Stress: Not on file  Social Connections: Not on file  Intimate Partner Violence: Not on file    Family History  Problem Relation Age of Onset   Stroke Father    Colon cancer Neg Hx      Review of Systems  Constitutional: Negative.  Negative for  chills and fever.  HENT: Negative.  Negative for congestion and sore throat.   Respiratory: Negative.  Negative for cough.   Cardiovascular: Negative.  Negative for chest pain and palpitations.  Skin: Negative.  Negative for rash.  All other systems reviewed and are negative.   Today's Vitals   10/29/21 1051  BP: 120/76  Pulse: 64  Temp: 98.2 F (36.8 C)  TempSrc: Oral  SpO2: 93%  Weight: 156 lb (70.8 kg)  Height: 5' 4.5" (1.638 m)   Body mass index is 26.36 kg/m. Wt Readings from Last 3 Encounters:  10/29/21 156 lb (70.8 kg)  04/23/21 154 lb (69.9 kg)  10/23/20 149 lb (67.6 kg)    Physical Exam Vitals reviewed.  Constitutional:       Appearance: Normal appearance.  HENT:     Head: Normocephalic.     Mouth/Throat:     Mouth: Mucous membranes are moist.     Pharynx: Oropharynx is clear.  Eyes:     Extraocular Movements: Extraocular movements intact.     Conjunctiva/sclera: Conjunctivae normal.     Pupils: Pupils are equal, round, and reactive to light.  Cardiovascular:     Rate and Rhythm: Normal rate and regular rhythm.     Pulses: Normal pulses.     Heart sounds: Normal heart sounds.  Pulmonary:     Effort: Pulmonary effort is normal.     Breath sounds: Normal breath sounds.  Abdominal:     General: There is no distension.     Palpations: Abdomen is soft.     Tenderness: There is no abdominal tenderness.  Musculoskeletal:     Cervical back: No tenderness.     Right lower leg: No edema.     Left lower leg: No edema.  Lymphadenopathy:     Cervical: No cervical adenopathy.  Skin:    General: Skin is warm and dry.     Capillary Refill: Capillary refill takes less than 2 seconds.  Neurological:     General: No focal deficit present.     Mental Status: She is alert and oriented to person, place, and time.  Psychiatric:        Mood and Affect: Mood normal.        Behavior: Behavior normal.      ASSESSMENT & PLAN: A total of 40 minutes was spent with the patient and counseling/coordination of care regarding preparing for this visit, review of most recent office visit notes, review of most recent blood work results, diagnosis of hypertension and cardiovascular risks associated with this condition, review of all medications, education on nutrition, prognosis, documentation, and need for follow-up in 6 months.  Problem List Items Addressed This Visit       Cardiovascular and Mediastinum   Essential hypertension - Primary    Well-controlled hypertension. BP Readings from Last 3 Encounters:  10/29/21 120/76  07/28/21 139/77  04/23/21 110/60  Continue Hyzaar 50-12.5 mg daily. Cardiovascular risks  associated with hypertension discussed. Dietary approaches to stop hypertension discussed. Follow-up in 6 months.       Patient Instructions  Health Maintenance After Age 29 After age 17, you are at a higher risk for certain long-term diseases and infections as well as injuries from falls. Falls are a major cause of broken bones and head injuries in people who are older than age 58. Getting regular preventive care can help to keep you healthy and well. Preventive care includes getting regular testing and making lifestyle changes as recommended by your  health care provider. Talk with your health care provider about: Which screenings and tests you should have. A screening is a test that checks for a disease when you have no symptoms. A diet and exercise plan that is right for you. What should I know about screenings and tests to prevent falls? Screening and testing are the best ways to find a health problem early. Early diagnosis and treatment give you the best chance of managing medical conditions that are common after age 25. Certain conditions and lifestyle choices may make you more likely to have a fall. Your health care provider may recommend: Regular vision checks. Poor vision and conditions such as cataracts can make you more likely to have a fall. If you wear glasses, make sure to get your prescription updated if your vision changes. Medicine review. Work with your health care provider to regularly review all of the medicines you are taking, including over-the-counter medicines. Ask your health care provider about any side effects that may make you more likely to have a fall. Tell your health care provider if any medicines that you take make you feel dizzy or sleepy. Strength and balance checks. Your health care provider may recommend certain tests to check your strength and balance while standing, walking, or changing positions. Foot health exam. Foot pain and numbness, as well as not wearing  proper footwear, can make you more likely to have a fall. Screenings, including: Osteoporosis screening. Osteoporosis is a condition that causes the bones to get weaker and break more easily. Blood pressure screening. Blood pressure changes and medicines to control blood pressure can make you feel dizzy. Depression screening. You may be more likely to have a fall if you have a fear of falling, feel depressed, or feel unable to do activities that you used to do. Alcohol use screening. Using too much alcohol can affect your balance and may make you more likely to have a fall. Follow these instructions at home: Lifestyle Do not drink alcohol if: Your health care provider tells you not to drink. If you drink alcohol: Limit how much you have to: 0-1 drink a day for women. 0-2 drinks a day for men. Know how much alcohol is in your drink. In the [place] one drink equals one 12 oz bottle of beer (355 mL), one 5 oz glass of wine (148 mL), or one 1 oz glass of hard liquor (44 mL). Do not use any products that contain nicotine or tobacco. These products include cigarettes, chewing tobacco, and vaping devices, such as e-cigarettes. If you need help quitting, ask your health care provider. Activity  Follow a regular exercise program to stay fit. This will help you maintain your balance. Ask your health care provider what types of exercise are appropriate for you. If you need a cane or walker, use it as recommended by your health care provider. Wear supportive shoes that have nonskid soles. Safety  Remove any tripping hazards, such as rugs, cords, and clutter. Install safety equipment such as grab bars in bathrooms and safety rails on stairs. Keep rooms and walkways well-lit. General instructions Talk with your health care provider about your risks for falling. Tell your health care provider if: You fall. Be sure to tell your health care provider about all falls, even ones that seem minor. You feel  dizzy, tiredness (fatigue), or off-balance. Take over-the-counter and prescription medicines only as told by your health care provider. These include supplements. Eat a healthy diet and maintain a healthy weight.  A healthy diet includes low-fat dairy products, low-fat (lean) meats, and fiber from whole grains, beans, and lots of fruits and vegetables. Stay current with your vaccines. Schedule regular health, dental, and eye exams. Summary Having a healthy lifestyle and getting preventive care can help to protect your health and wellness after age 74. Screening and testing are the best way to find a health problem early and help you avoid having a fall. Early diagnosis and treatment give you the best chance for managing medical conditions that are more common for people who are older than age 74. Falls are a major cause of broken bones and head injuries in people who are older than age 965. Take precautions to prevent a fall at home. Work with your health care provider to learn what changes you can make to improve your health and wellness and to prevent falls. This information is not intended to replace advice given to you by your health care provider. Make sure you discuss any questions you have with your health care provider. Document Revised: 06/19/2020 Document Reviewed: 06/19/2020 Elsevier Patient Education  2023 Elsevier Inc.    Edwina BarthMiguel Keonda Dow, MD [facility]

## 2021-11-14 ENCOUNTER — Other Ambulatory Visit: Payer: Self-pay | Admitting: Emergency Medicine

## 2021-11-14 DIAGNOSIS — I10 Essential (primary) hypertension: Secondary | ICD-10-CM

## 2021-11-14 DIAGNOSIS — F4010 Social phobia, unspecified: Secondary | ICD-10-CM

## 2022-04-29 ENCOUNTER — Ambulatory Visit: Payer: BC Managed Care – PPO | Admitting: Emergency Medicine

## 2022-04-29 ENCOUNTER — Encounter: Payer: Self-pay | Admitting: Emergency Medicine

## 2022-04-29 VITALS — BP 118/74 | HR 78 | Temp 98.2°F | Ht 64.5 in | Wt 157.1 lb

## 2022-04-29 DIAGNOSIS — I10 Essential (primary) hypertension: Secondary | ICD-10-CM | POA: Diagnosis not present

## 2022-04-29 LAB — LIPID PANEL
Cholesterol: 198 mg/dL (ref 0–200)
HDL: 58.4 mg/dL (ref >39.00)
LDL Cholesterol: 121 mg/dL — ABNORMAL HIGH (ref 0–99)
NonHDL: 139.67
Total CHOL/HDL Ratio: 3
Triglycerides: 91 mg/dL (ref 0.0–149.0)
VLDL: 18.2 mg/dL (ref 0.0–40.0)

## 2022-04-29 LAB — COMPREHENSIVE METABOLIC PANEL
ALT: 14 U/L (ref 0–35)
AST: 40 U/L — ABNORMAL HIGH (ref 0–37)
Albumin: 4.2 g/dL (ref 3.5–5.2)
Alkaline Phosphatase: 56 U/L (ref 39–117)
BUN: 17 mg/dL (ref 6–23)
CO2: 27 mEq/L (ref 19–32)
Calcium: 9.6 mg/dL (ref 8.4–10.5)
Chloride: 105 mEq/L (ref 96–112)
Creatinine, Ser: 0.83 mg/dL (ref 0.40–1.20)
GFR: 69.5 mL/min (ref >60.00)
Glucose, Bld: 99 mg/dL (ref 70–99)
Potassium: 4.1 mEq/L (ref 3.5–5.1)
Sodium: 140 mEq/L (ref 135–145)
Total Bilirubin: 0.5 mg/dL (ref 0.2–1.2)
Total Protein: 7.2 g/dL (ref 6.0–8.3)

## 2022-04-29 LAB — CBC WITH DIFFERENTIAL/PLATELET
Basophils Absolute: 0 10*3/uL (ref 0.0–0.1)
Basophils Relative: 0.8 % (ref 0.0–3.0)
Eosinophils Absolute: 0.1 10*3/uL (ref 0.0–0.7)
Eosinophils Relative: 2.7 % (ref 0.0–5.0)
HCT: 38.3 % (ref 36.0–46.0)
Hemoglobin: 12.9 g/dL (ref 12.0–15.0)
Lymphocytes Relative: 30.9 % (ref 12.0–46.0)
Lymphs Abs: 1.6 10*3/uL (ref 0.7–4.0)
MCHC: 33.8 g/dL (ref 30.0–36.0)
MCV: 93 fl (ref 78.0–100.0)
Monocytes Absolute: 0.5 10*3/uL (ref 0.1–1.0)
Monocytes Relative: 10.3 % (ref 3.0–12.0)
Neutro Abs: 3 10*3/uL (ref 1.4–7.7)
Neutrophils Relative %: 55.3 % (ref 43.0–77.0)
Platelets: 255 10*3/uL (ref 150.0–400.0)
RBC: 4.12 Mil/uL (ref 3.87–5.11)
RDW: 13.2 % (ref 11.5–15.5)
WBC: 5.3 10*3/uL (ref 4.0–10.5)

## 2022-04-29 LAB — HEMOGLOBIN A1C: Hgb A1c MFr Bld: 5.8 % (ref 4.6–6.5)

## 2022-04-29 NOTE — Progress Notes (Signed)
[name] 77 y.o.   Chief Complaint  Patient presents with   Medical Management of Chronic Issues    50mnth f/u appt, no concerns     HISTORY OF PRESENT ILLNESS: This is a 77 y.o. female here for follow-up of chronic medical conditions including hypertension Doing well.  Has no complaints or medical concerns today. BP Readings from Last 3 Encounters:  04/29/22 118/74  10/29/21 120/76  07/28/21 139/77   Wt Readings from Last 3 Encounters:  04/29/22 157 lb 2 oz (71.3 kg)  10/29/21 156 lb (70.8 kg)  04/23/21 154 lb (69.9 kg)     HPI   Prior to Admission medications   Medication Sig Start Date End Date Taking? Authorizing Provider  EPINEPHrine 0.3 mg/0.3 mL IJ SOAJ injection Inject 0.3 mLs (0.3 mg total) into the muscle as needed for up to 1 dose for anaphylaxis. 09/16/19  Yes Soto, Beverley Fiedler, PA-C  losartan-hydrochlorothiazide (HYZAAR) 50-12.5 MG tablet TAKE 1 TABLET BY MOUTH EVERY DAY 11/14/21  Yes Horald Pollen, MD  sertraline (ZOLOFT) 25 MG tablet TAKE 1 TABLET BY MOUTH EVERY DAY 11/14/21  Yes Gerold Sar, Ines Bloomer, MD  tobramycin-dexamethasone [facility]) ophthalmic solution SMARTSIG:In Eye(s) 03/07/21  Yes [provider]    Allergies  Allergen Reactions   Yellow Jacket Venom [Bee Venom] Rash    Patient Active Problem List   Diagnosis Date Noted   Essential hypertension 10/28/2018   Cystocele with prolapse 12/09/2017   SUI (stress urinary incontinence, female) 07/17/2016   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    Past Medical History:  Diagnosis Date   Cystocele with prolapse    VAULT PROLAPSE   History of kidney stones    Macular degeneration BOTH EYES    Past Surgical History:  Procedure Laterality Date   BREAST ENHANCEMENT SURGERY  2007   CYSTOCELE REPAIR N/A 12/09/2017   Procedure: ANTERIOR REPAIR (CYSTOCELE);  Surgeon: Bjorn Loser, MD;  Location: [facility];  Service: Urology;  Laterality: N/A;   CYSTOSCOPY N/A 12/09/2017   Procedure:  CYSTOSCOPY;  Surgeon: Bjorn Loser, MD;  Location: [facility];  Service: Urology;  Laterality: N/A;   NECK MASS EXCISION  2012   TUBAL LIGATION  1984   VAGINAL HYSTERECTOMY  2007   PARTIAL, AND BLADDER TACH DONE   VAGINAL PROLAPSE REPAIR N/A 12/09/2017   Procedure: VAGINAL VAULT PROLAPSE REPAIR WITH GRAFT;  Surgeon: Bjorn Loser, MD;  Location: [facility];  Service: Urology;  Laterality: N/A;    Social History   Socioeconomic History   Marital status: Married    Spouse name: Not on file   Number of children: Not on file   Years of education: Not on file   Highest education level: Not on file  Occupational History   Not on file  Tobacco Use   Smoking status: Never   Smokeless tobacco: Never  Vaping Use   Vaping Use: Never used  Substance and Sexual Activity   Alcohol use: No   Drug use: No   Sexual activity: Not on file  Other Topics Concern   Not on file  Social History Narrative   Not on file   Social Determinants of Health   Financial Resource Strain: Not on file  Food Insecurity: Not on file  Transportation Needs: Not on file  Physical Activity: Not on file  Stress: Not on file  Social Connections: Not on file  Intimate Partner Violence: Not on file    Family History  Problem Relation Age of Onset  Stroke Father    Colon cancer Neg Hx      Review of Systems  Constitutional: Negative.  Negative for chills and fever.  HENT: Negative.  Negative for congestion and sore throat.   Respiratory: Negative.  Negative for cough and shortness of breath.   Cardiovascular: Negative.  Negative for chest pain and palpitations.  Gastrointestinal:  Negative for abdominal pain, diarrhea, nausea and vomiting.  Skin: Negative.  Negative for rash.  Neurological: Negative.  Negative for dizziness and headaches.  All other systems reviewed and are negative.   Vitals:   04/29/22 0946  BP: 118/74  Pulse: 78  Temp: 98.2 F (36.8 C)  SpO2: 95%    Physical Exam Vitals  reviewed.  Constitutional:      Appearance: Normal appearance.  HENT:     Head: Normocephalic.     Mouth/Throat:     Mouth: Mucous membranes are moist.     Pharynx: Oropharynx is clear.  Eyes:     Extraocular Movements: Extraocular movements intact.     Conjunctiva/sclera: Conjunctivae normal.     Pupils: Pupils are equal, round, and reactive to light.  Cardiovascular:     Rate and Rhythm: Normal rate and regular rhythm.     Pulses: Normal pulses.     Heart sounds: Normal heart sounds.  Pulmonary:     Effort: Pulmonary effort is normal.     Breath sounds: Normal breath sounds.  Musculoskeletal:     Cervical back: No tenderness.     Right lower leg: No edema.     Left lower leg: No edema.  Lymphadenopathy:     Cervical: No cervical adenopathy.  Skin:    General: Skin is warm and dry.  Neurological:     General: No focal deficit present.     Mental Status: She is alert and oriented to person, place, and time.  Psychiatric:        Mood and Affect: Mood normal.        Behavior: Behavior normal.      ASSESSMENT & PLAN: A total of 43 minutes was spent with the patient and counseling/coordination of care regarding preparing for this visit, review of most recent office visit notes, diagnosis of hypertension and cardiovascular risk associated with this condition, review of all medications, review of most recent blood work results, review of health maintenance items, education on nutrition, prognosis, documentation, need for follow-up.  Problem List Items Addressed This Visit       Cardiovascular and Mediastinum   Essential hypertension - Primary    Well-controlled hypertension. Continue Hyzaar 50-12.5 mg daily Cardiovascular risks associated with hypertension discussed Diet and nutrition discussed Blood work done today Follow-up in 6 months      Relevant Orders   CBC with Differential/Platelet   Comprehensive metabolic panel   Hemoglobin A1c   Lipid panel   Patient  Instructions  Health Maintenance After Age 38 After age 79, you are at a higher risk for certain long-term diseases and infections as well as injuries from falls. Falls are a major cause of broken bones and head injuries in people who are older than age 55. Getting regular preventive care can help to keep you healthy and well. Preventive care includes getting regular testing and making lifestyle changes as recommended by your health care provider. Talk with your health care provider about: Which screenings and tests you should have. A screening is a test that checks for a disease when you have no symptoms. A diet and  exercise plan that is right for you. What should I know about screenings and tests to prevent falls? Screening and testing are the best ways to find a health problem early. Early diagnosis and treatment give you the best chance of managing medical conditions that are common after age 82. Certain conditions and lifestyle choices may make you more likely to have a fall. Your health care provider may recommend: Regular vision checks. Poor vision and conditions such as cataracts can make you more likely to have a fall. If you wear glasses, make sure to get your prescription updated if your vision changes. Medicine review. Work with your health care provider to regularly review all of the medicines you are taking, including over-the-counter medicines. Ask your health care provider about any side effects that may make you more likely to have a fall. Tell your health care provider if any medicines that you take make you feel dizzy or sleepy. Strength and balance checks. Your health care provider may recommend certain tests to check your strength and balance while standing, walking, or changing positions. Foot health exam. Foot pain and numbness, as well as not wearing proper footwear, can make you more likely to have a fall. Screenings, including: Osteoporosis screening. Osteoporosis is a condition  that causes the bones to get weaker and break more easily. Blood pressure screening. Blood pressure changes and medicines to control blood pressure can make you feel dizzy. Depression screening. You may be more likely to have a fall if you have a fear of falling, feel depressed, or feel unable to do activities that you used to do. Alcohol use screening. Using too much alcohol can affect your balance and may make you more likely to have a fall. Follow these instructions at home: Lifestyle Do not drink alcohol if: Your health care provider tells you not to drink. If you drink alcohol: Limit how much you have to: 0-1 drink a day for women. 0-2 drinks a day for men. Know how much alcohol is in your drink. In the [place] one drink equals one 12 oz bottle of beer (355 mL), one 5 oz glass of wine (148 mL), or one 1 oz glass of hard liquor (44 mL). Do not use any products that contain nicotine or tobacco. These products include cigarettes, chewing tobacco, and vaping devices, such as e-cigarettes. If you need help quitting, ask your health care provider. Activity  Follow a regular exercise program to stay fit. This will help you maintain your balance. Ask your health care provider what types of exercise are appropriate for you. If you need a cane or walker, use it as recommended by your health care provider. Wear supportive shoes that have nonskid soles. Safety  Remove any tripping hazards, such as rugs, cords, and clutter. Install safety equipment such as grab bars in bathrooms and safety rails on stairs. Keep rooms and walkways well-lit. General instructions Talk with your health care provider about your risks for falling. Tell your health care provider if: You fall. Be sure to tell your health care provider about all falls, even ones that seem minor. You feel dizzy, tiredness (fatigue), or off-balance. Take over-the-counter and prescription medicines only as told by your health care provider.  These include supplements. Eat a healthy diet and maintain a healthy weight. A healthy diet includes low-fat dairy products, low-fat (lean) meats, and fiber from whole grains, beans, and lots of fruits and vegetables. Stay current with your vaccines. Schedule regular health, dental, and eye exams. Summary  Having a healthy lifestyle and getting preventive care can help to protect your health and wellness after age 22. Screening and testing are the best way to find a health problem early and help you avoid having a fall. Early diagnosis and treatment give you the best chance for managing medical conditions that are more common for people who are older than age 74. Falls are a major cause of broken bones and head injuries in people who are older than age 1. Take precautions to prevent a fall at home. Work with your health care provider to learn what changes you can make to improve your health and wellness and to prevent falls. This information is not intended to replace advice given to you by your health care provider. Make sure you discuss any questions you have with your health care provider. Document Revised: 06/19/2020 Document Reviewed: 06/19/2020 Elsevier Patient Education  [place], MD [facility]

## 2022-04-29 NOTE — Patient Instructions (Signed)
Health Maintenance After Age 75 After age 75, you are at a higher risk for certain long-term diseases and infections as well as injuries from falls. Falls are a major cause of broken bones and head injuries in people who are older than age 75. Getting regular preventive care can help to keep you healthy and well. Preventive care includes getting regular testing and making lifestyle changes as recommended by your health care provider. Talk with your health care provider about: Which screenings and tests you should have. A screening is a test that checks for a disease when you have no symptoms. A diet and exercise plan that is right for you. What should I know about screenings and tests to prevent falls? Screening and testing are the best ways to find a health problem early. Early diagnosis and treatment give you the best chance of managing medical conditions that are common after age 75. Certain conditions and lifestyle choices may make you more likely to have a fall. Your health care provider may recommend: Regular vision checks. Poor vision and conditions such as cataracts can make you more likely to have a fall. If you wear glasses, make sure to get your prescription updated if your vision changes. Medicine review. Work with your health care provider to regularly review all of the medicines you are taking, including over-the-counter medicines. Ask your health care provider about any side effects that may make you more likely to have a fall. Tell your health care provider if any medicines that you take make you feel dizzy or sleepy. Strength and balance checks. Your health care provider may recommend certain tests to check your strength and balance while standing, walking, or changing positions. Foot health exam. Foot pain and numbness, as well as not wearing proper footwear, can make you more likely to have a fall. Screenings, including: Osteoporosis screening. Osteoporosis is a condition that causes  the bones to get weaker and break more easily. Blood pressure screening. Blood pressure changes and medicines to control blood pressure can make you feel dizzy. Depression screening. You may be more likely to have a fall if you have a fear of falling, feel depressed, or feel unable to do activities that you used to do. Alcohol use screening. Using too much alcohol can affect your balance and may make you more likely to have a fall. Follow these instructions at home: Lifestyle Do not drink alcohol if: Your health care provider tells you not to drink. If you drink alcohol: Limit how much you have to: 0-1 drink a day for women. 0-2 drinks a day for men. Know how much alcohol is in your drink. In the [place] one drink equals one 12 oz bottle of beer (355 mL), one 5 oz glass of wine (148 mL), or one 1 oz glass of hard liquor (44 mL). Do not use any products that contain nicotine or tobacco. These products include cigarettes, chewing tobacco, and vaping devices, such as e-cigarettes. If you need help quitting, ask your health care provider. Activity  Follow a regular exercise program to stay fit. This will help you maintain your balance. Ask your health care provider what types of exercise are appropriate for you. If you need a cane or walker, use it as recommended by your health care provider. Wear supportive shoes that have nonskid soles. Safety  Remove any tripping hazards, such as rugs, cords, and clutter. Install safety equipment such as grab bars in bathrooms and safety rails on stairs. Keep rooms and walkways   well-lit. General instructions Talk with your health care provider about your risks for falling. Tell your health care provider if: You fall. Be sure to tell your health care provider about all falls, even ones that seem minor. You feel dizzy, tiredness (fatigue), or off-balance. Take over-the-counter and prescription medicines only as told by your health care provider. These include  supplements. Eat a healthy diet and maintain a healthy weight. A healthy diet includes low-fat dairy products, low-fat (lean) meats, and fiber from whole grains, beans, and lots of fruits and vegetables. Stay current with your vaccines. Schedule regular health, dental, and eye exams. Summary Having a healthy lifestyle and getting preventive care can help to protect your health and wellness after age 75. Screening and testing are the best way to find a health problem early and help you avoid having a fall. Early diagnosis and treatment give you the best chance for managing medical conditions that are more common for people who are older than age 75. Falls are a major cause of broken bones and head injuries in people who are older than age 75. Take precautions to prevent a fall at home. Work with your health care provider to learn what changes you can make to improve your health and wellness and to prevent falls. This information is not intended to replace advice given to you by your health care provider. Make sure you discuss any questions you have with your health care provider. Document Revised: 06/19/2020 Document Reviewed: 06/19/2020 Elsevier Patient Education  2023 Elsevier Inc.  

## 2022-04-29 NOTE — Assessment & Plan Note (Signed)
Well-controlled hypertension. Continue Hyzaar 50-12.5 mg daily Cardiovascular risks associated with hypertension discussed Diet and nutrition discussed Blood work done today Follow-up in 6 months

## 2022-06-09 ENCOUNTER — Other Ambulatory Visit: Payer: Self-pay | Admitting: Emergency Medicine

## 2022-06-09 DIAGNOSIS — I10 Essential (primary) hypertension: Secondary | ICD-10-CM

## 2022-09-26 ENCOUNTER — Encounter (INDEPENDENT_AMBULATORY_CARE_PROVIDER_SITE_OTHER): Payer: Self-pay

## 2022-10-21 ENCOUNTER — Ambulatory Visit: Payer: BC Managed Care – PPO | Admitting: Emergency Medicine

## 2022-10-21 ENCOUNTER — Encounter: Payer: Self-pay | Admitting: Emergency Medicine

## 2022-10-21 VITALS — BP 130/88 | HR 71 | Temp 97.9°F | Wt 152.0 lb

## 2022-10-21 DIAGNOSIS — I10 Essential (primary) hypertension: Secondary | ICD-10-CM

## 2022-10-21 LAB — LIPID PANEL
Cholesterol: 198 mg/dL (ref 0–200)
HDL: 54.3 mg/dL (ref >39.00)
LDL Cholesterol: 119 mg/dL — ABNORMAL HIGH (ref 0–99)
NonHDL: 143.22
Total CHOL/HDL Ratio: 4
Triglycerides: 123 mg/dL (ref 0.0–149.0)
VLDL: 24.6 mg/dL (ref 0.0–40.0)

## 2022-10-21 LAB — CBC WITH DIFFERENTIAL/PLATELET
Basophils Absolute: 0 10*3/uL (ref 0.0–0.1)
Basophils Relative: 0.5 % (ref 0.0–3.0)
Eosinophils Absolute: 0.1 10*3/uL (ref 0.0–0.7)
Eosinophils Relative: 2.1 % (ref 0.0–5.0)
HCT: 38.9 % (ref 36.0–46.0)
Hemoglobin: 12.9 g/dL (ref 12.0–15.0)
Lymphocytes Relative: 30.5 % (ref 12.0–46.0)
Lymphs Abs: 1.8 10*3/uL (ref 0.7–4.0)
MCHC: 33.2 g/dL (ref 30.0–36.0)
MCV: 92.6 fl (ref 78.0–100.0)
Monocytes Absolute: 0.6 10*3/uL (ref 0.1–1.0)
Monocytes Relative: 10.6 % (ref 3.0–12.0)
Neutro Abs: 3.2 10*3/uL (ref 1.4–7.7)
Neutrophils Relative %: 56.3 % (ref 43.0–77.0)
Platelets: 287 10*3/uL (ref 150.0–400.0)
RBC: 4.2 Mil/uL (ref 3.87–5.11)
RDW: 12.9 % (ref 11.5–15.5)
WBC: 5.8 10*3/uL (ref 4.0–10.5)

## 2022-10-21 LAB — COMPREHENSIVE METABOLIC PANEL
ALT: 16 U/L (ref 0–35)
AST: 39 U/L — ABNORMAL HIGH (ref 0–37)
Albumin: 4.2 g/dL (ref 3.5–5.2)
Alkaline Phosphatase: 57 U/L (ref 39–117)
BUN: 16 mg/dL (ref 6–23)
CO2: 28 meq/L (ref 19–32)
Calcium: 9.5 mg/dL (ref 8.4–10.5)
Chloride: 103 meq/L (ref 96–112)
Creatinine, Ser: 0.8 mg/dL (ref 0.40–1.20)
GFR: 72.4 mL/min (ref >60.00)
Glucose, Bld: 86 mg/dL (ref 70–99)
Potassium: 3.6 meq/L (ref 3.5–5.1)
Sodium: 138 meq/L (ref 135–145)
Total Bilirubin: 0.6 mg/dL (ref 0.2–1.2)
Total Protein: 7.3 g/dL (ref 6.0–8.3)

## 2022-10-21 LAB — HEMOGLOBIN A1C: Hgb A1c MFr Bld: 5.9 % (ref 4.6–6.5)

## 2022-10-21 NOTE — Progress Notes (Signed)
[name] 77 y.o.   Chief Complaint  Patient presents with   Follow-up    94month follow up    HISTORY OF PRESENT ILLNESS: This is a 77 y.o. female here for 35-month follow-up of chronic medical conditions hypertension in particular. Overall doing well. No complaints or medical concerns today. Up-to-date with mammograms and colonoscopy Had mammogram last year  HPI   Prior to Admission medications   Medication Sig Start Date End Date Taking? Authorizing Provider  EPINEPHrine 0.3 mg/0.3 mL IJ SOAJ injection Inject 0.3 mLs (0.3 mg total) into the muscle as needed for up to 1 dose for anaphylaxis. 09/16/19  Yes Soto, Leonie Douglas, PA-C  losartan-hydrochlorothiazide (HYZAAR) 50-12.5 MG tablet TAKE 1 TABLET BY MOUTH EVERY DAY 06/09/22  Yes Georgina Quint, MD  sertraline (ZOLOFT) 25 MG tablet TAKE 1 TABLET BY MOUTH EVERY DAY 11/14/21  Yes Idris Edmundson, Eilleen Kempf, MD  tobramycin-dexamethasone [facility]) ophthalmic solution SMARTSIG:In Eye(s) 03/07/21  Yes [provider]    Allergies  Allergen Reactions   Yellow Jacket Venom [Bee Venom] Rash    Patient Active Problem List   Diagnosis Date Noted   Essential hypertension 10/28/2018   Cystocele with prolapse 12/09/2017   SUI (stress urinary incontinence, female) 07/17/2016   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    Past Medical History:  Diagnosis Date   Cystocele with prolapse    VAULT PROLAPSE   History of kidney stones    Macular degeneration BOTH EYES    Past Surgical History:  Procedure Laterality Date   BREAST ENHANCEMENT SURGERY  2007   CYSTOCELE REPAIR N/A 12/09/2017   Procedure: ANTERIOR REPAIR (CYSTOCELE);  Surgeon: Alfredo Martinez, MD;  Location: [facility];  Service: Urology;  Laterality: N/A;   CYSTOSCOPY N/A 12/09/2017   Procedure: CYSTOSCOPY;  Surgeon: Alfredo Martinez, MD;  Location: [facility];  Service: Urology;  Laterality: N/A;   NECK MASS EXCISION  2012   TUBAL LIGATION  1984   VAGINAL HYSTERECTOMY   2007   PARTIAL, AND BLADDER TACH DONE   VAGINAL PROLAPSE REPAIR N/A 12/09/2017   Procedure: VAGINAL VAULT PROLAPSE REPAIR WITH GRAFT;  Surgeon: Alfredo Martinez, MD;  Location: [facility];  Service: Urology;  Laterality: N/A;    Social History   Socioeconomic History   Marital status: Married    Spouse name: Not on file   Number of children: Not on file   Years of education: Not on file   Highest education level: Not on file  Occupational History   Not on file  Tobacco Use   Smoking status: Never   Smokeless tobacco: Never  Vaping Use   Vaping status: Never Used  Substance and Sexual Activity   Alcohol use: No   Drug use: No   Sexual activity: Not on file  Other Topics Concern   Not on file  Social History Narrative   Not on file   Social Determinants of Health   Financial Resource Strain: Not on file  Food Insecurity: Not on file  Transportation Needs: Not on file  Physical Activity: Not on file  Stress: Not on file  Social Connections: Unknown (06/26/2021)   Received from [facility], [facility]   Social Network    Social Network: Not on file  Intimate Partner Violence: Unknown (05/18/2021)   Received from [facility], [facility]   HITS    Physically Hurt: Not on file    Insult or Talk Down To: Not on file    Threaten Physical Harm: Not on  file    Scream or Curse: Not on file    Family History  Problem Relation Age of Onset   Stroke Father    Colon cancer Neg Hx      Review of Systems  Constitutional: Negative.  Negative for chills and fever.  HENT: Negative.  Negative for congestion and sore throat.   Respiratory: Negative.  Negative for cough and shortness of breath.   Cardiovascular: Negative.  Negative for chest pain and palpitations.  Gastrointestinal:  Negative for abdominal pain, diarrhea, nausea and vomiting.  Genitourinary: Negative.  Negative for dysuria and hematuria.  Skin: Negative.  Negative for rash.  Neurological: Negative.   Negative for dizziness and headaches.  All other systems reviewed and are negative.   Vitals:   10/21/22 1016  BP: 130/88  Pulse: 71  Temp: 97.9 F (36.6 C)  SpO2: 92%    Physical Exam Constitutional:      Appearance: Normal appearance.  HENT:     Head: Normocephalic.     Mouth/Throat:     Mouth: Mucous membranes are moist.     Pharynx: Oropharynx is clear.  Eyes:     Extraocular Movements: Extraocular movements intact.     Conjunctiva/sclera: Conjunctivae normal.     Pupils: Pupils are equal, round, and reactive to light.  Cardiovascular:     Rate and Rhythm: Normal rate and regular rhythm.     Pulses: Normal pulses.     Heart sounds: Normal heart sounds.  Pulmonary:     Effort: Pulmonary effort is normal.     Breath sounds: Normal breath sounds.  Musculoskeletal:     Cervical back: No tenderness.     Right lower leg: No edema.     Left lower leg: No edema.  Lymphadenopathy:     Cervical: No cervical adenopathy.  Skin:    General: Skin is warm and dry.     Capillary Refill: Capillary refill takes less than 2 seconds.  Neurological:     General: No focal deficit present.     Mental Status: She is alert and oriented to person, place, and time.  Psychiatric:        Mood and Affect: Mood normal.        Behavior: Behavior normal.      ASSESSMENT & PLAN: A total of 42 minutes was spent with the patient and counseling/coordination of care regarding preparing for this visit, review of most recent office visit notes, review of chronic medical conditions under management, review of all medications, review of most recent blood work results, review of health maintenance items, education on nutrition, cardiovascular risks associated with hypertension, prognosis, documentation, and need for follow-up.  Problem List Items Addressed This Visit       Cardiovascular and Mediastinum   Essential hypertension - Primary    Well-controlled hypertension with normal blood pressure  readings at home Continue Hyzaar 50-12.5 mg daily Cardiovascular risks associated with hypertension discussed. Dietary approaches to stop hypertension discussed Blood work done today Recommend follow-up in 6 months      Relevant Orders   CBC with Differential/Platelet   Comprehensive metabolic panel   Hemoglobin A1c   Lipid panel   Patient Instructions  Health Maintenance After Age 73 After age 70, you are at a higher risk for certain long-term diseases and infections as well as injuries from falls. Falls are a major cause of broken bones and head injuries in people who are older than age 54. Getting regular preventive care can help  to keep you healthy and well. Preventive care includes getting regular testing and making lifestyle changes as recommended by your health care provider. Talk with your health care provider about: Which screenings and tests you should have. A screening is a test that checks for a disease when you have no symptoms. A diet and exercise plan that is right for you. What should I know about screenings and tests to prevent falls? Screening and testing are the best ways to find a health problem early. Early diagnosis and treatment give you the best chance of managing medical conditions that are common after age 67. Certain conditions and lifestyle choices may make you more likely to have a fall. Your health care provider may recommend: Regular vision checks. Poor vision and conditions such as cataracts can make you more likely to have a fall. If you wear glasses, make sure to get your prescription updated if your vision changes. Medicine review. Work with your health care provider to regularly review all of the medicines you are taking, including over-the-counter medicines. Ask your health care provider about any side effects that may make you more likely to have a fall. Tell your health care provider if any medicines that you take make you feel dizzy or sleepy. Strength  and balance checks. Your health care provider may recommend certain tests to check your strength and balance while standing, walking, or changing positions. Foot health exam. Foot pain and numbness, as well as not wearing proper footwear, can make you more likely to have a fall. Screenings, including: Osteoporosis screening. Osteoporosis is a condition that causes the bones to get weaker and break more easily. Blood pressure screening. Blood pressure changes and medicines to control blood pressure can make you feel dizzy. Depression screening. You may be more likely to have a fall if you have a fear of falling, feel depressed, or feel unable to do activities that you used to do. Alcohol use screening. Using too much alcohol can affect your balance and may make you more likely to have a fall. Follow these instructions at home: Lifestyle Do not drink alcohol if: Your health care provider tells you not to drink. If you drink alcohol: Limit how much you have to: 0-1 drink a day for women. 0-2 drinks a day for men. Know how much alcohol is in your drink. In the [place] one drink equals one 12 oz bottle of beer (355 mL), one 5 oz glass of wine (148 mL), or one 1 oz glass of hard liquor (44 mL). Do not use any products that contain nicotine or tobacco. These products include cigarettes, chewing tobacco, and vaping devices, such as e-cigarettes. If you need help quitting, ask your health care provider. Activity  Follow a regular exercise program to stay fit. This will help you maintain your balance. Ask your health care provider what types of exercise are appropriate for you. If you need a cane or walker, use it as recommended by your health care provider. Wear supportive shoes that have nonskid soles. Safety  Remove any tripping hazards, such as rugs, cords, and clutter. Install safety equipment such as grab bars in bathrooms and safety rails on stairs. Keep rooms and walkways well-lit. General  instructions Talk with your health care provider about your risks for falling. Tell your health care provider if: You fall. Be sure to tell your health care provider about all falls, even ones that seem minor. You feel dizzy, tiredness (fatigue), or off-balance. Take over-the-counter and prescription medicines  only as told by your health care provider. These include supplements. Eat a healthy diet and maintain a healthy weight. A healthy diet includes low-fat dairy products, low-fat (lean) meats, and fiber from whole grains, beans, and lots of fruits and vegetables. Stay current with your vaccines. Schedule regular health, dental, and eye exams. Summary Having a healthy lifestyle and getting preventive care can help to protect your health and wellness after age 83. Screening and testing are the best way to find a health problem early and help you avoid having a fall. Early diagnosis and treatment give you the best chance for managing medical conditions that are more common for people who are older than age 28. Falls are a major cause of broken bones and head injuries in people who are older than age 22. Take precautions to prevent a fall at home. Work with your health care provider to learn what changes you can make to improve your health and wellness and to prevent falls. This information is not intended to replace advice given to you by your health care provider. Make sure you discuss any questions you have with your health care provider. Document Revised: 06/19/2020 Document Reviewed: 06/19/2020 Elsevier Patient Education  2024 Elsevier Inc.      Edwina Barth, MD [facility]

## 2022-10-21 NOTE — Patient Instructions (Signed)
Health Maintenance After Age 75 After age 75, you are at a higher risk for certain long-term diseases and infections as well as injuries from falls. Falls are a major cause of broken bones and head injuries in people who are older than age 75. Getting regular preventive care can help to keep you healthy and well. Preventive care includes getting regular testing and making lifestyle changes as recommended by your health care provider. Talk with your health care provider about: Which screenings and tests you should have. A screening is a test that checks for a disease when you have no symptoms. A diet and exercise plan that is right for you. What should I know about screenings and tests to prevent falls? Screening and testing are the best ways to find a health problem early. Early diagnosis and treatment give you the best chance of managing medical conditions that are common after age 75. Certain conditions and lifestyle choices may make you more likely to have a fall. Your health care provider may recommend: Regular vision checks. Poor vision and conditions such as cataracts can make you more likely to have a fall. If you wear glasses, make sure to get your prescription updated if your vision changes. Medicine review. Work with your health care provider to regularly review all of the medicines you are taking, including over-the-counter medicines. Ask your health care provider about any side effects that may make you more likely to have a fall. Tell your health care provider if any medicines that you take make you feel dizzy or sleepy. Strength and balance checks. Your health care provider may recommend certain tests to check your strength and balance while standing, walking, or changing positions. Foot health exam. Foot pain and numbness, as well as not wearing proper footwear, can make you more likely to have a fall. Screenings, including: Osteoporosis screening. Osteoporosis is a condition that causes  the bones to get weaker and break more easily. Blood pressure screening. Blood pressure changes and medicines to control blood pressure can make you feel dizzy. Depression screening. You may be more likely to have a fall if you have a fear of falling, feel depressed, or feel unable to do activities that you used to do. Alcohol use screening. Using too much alcohol can affect your balance and may make you more likely to have a fall. Follow these instructions at home: Lifestyle Do not drink alcohol if: Your health care provider tells you not to drink. If you drink alcohol: Limit how much you have to: 0-1 drink a day for women. 0-2 drinks a day for men. Know how much alcohol is in your drink. In the [place] one drink equals one 12 oz bottle of beer (355 mL), one 5 oz glass of wine (148 mL), or one 1 oz glass of hard liquor (44 mL). Do not use any products that contain nicotine or tobacco. These products include cigarettes, chewing tobacco, and vaping devices, such as e-cigarettes. If you need help quitting, ask your health care provider. Activity  Follow a regular exercise program to stay fit. This will help you maintain your balance. Ask your health care provider what types of exercise are appropriate for you. If you need a cane or walker, use it as recommended by your health care provider. Wear supportive shoes that have nonskid soles. Safety  Remove any tripping hazards, such as rugs, cords, and clutter. Install safety equipment such as grab bars in bathrooms and safety rails on stairs. Keep rooms and walkways   well-lit. General instructions Talk with your health care provider about your risks for falling. Tell your health care provider if: You fall. Be sure to tell your health care provider about all falls, even ones that seem minor. You feel dizzy, tiredness (fatigue), or off-balance. Take over-the-counter and prescription medicines only as told by your health care provider. These include  supplements. Eat a healthy diet and maintain a healthy weight. A healthy diet includes low-fat dairy products, low-fat (lean) meats, and fiber from whole grains, beans, and lots of fruits and vegetables. Stay current with your vaccines. Schedule regular health, dental, and eye exams. Summary Having a healthy lifestyle and getting preventive care can help to protect your health and wellness after age 75. Screening and testing are the best way to find a health problem early and help you avoid having a fall. Early diagnosis and treatment give you the best chance for managing medical conditions that are more common for people who are older than age 75. Falls are a major cause of broken bones and head injuries in people who are older than age 75. Take precautions to prevent a fall at home. Work with your health care provider to learn what changes you can make to improve your health and wellness and to prevent falls. This information is not intended to replace advice given to you by your health care provider. Make sure you discuss any questions you have with your health care provider. Document Revised: 06/19/2020 Document Reviewed: 06/19/2020 Elsevier Patient Education  2024 Elsevier Inc.  

## 2022-10-21 NOTE — Assessment & Plan Note (Signed)
Well-controlled hypertension with normal blood pressure readings at home Continue Hyzaar 50-12.5 mg daily Cardiovascular risks associated with hypertension discussed. Dietary approaches to stop hypertension discussed Blood work done today Recommend follow-up in 6 months

## 2022-11-02 ENCOUNTER — Other Ambulatory Visit: Payer: Self-pay | Admitting: Emergency Medicine

## 2022-11-02 DIAGNOSIS — F4010 Social phobia, unspecified: Secondary | ICD-10-CM

## 2022-11-30 ENCOUNTER — Other Ambulatory Visit: Payer: Self-pay | Admitting: Emergency Medicine

## 2022-11-30 DIAGNOSIS — I10 Essential (primary) hypertension: Secondary | ICD-10-CM

## 2023-03-23 ENCOUNTER — Telehealth: Payer: 59 | Admitting: Physician Assistant

## 2023-03-23 DIAGNOSIS — R6889 Other general symptoms and signs: Secondary | ICD-10-CM

## 2023-03-23 MED ORDER — BENZONATATE 100 MG PO CAPS: 100.0000 mg | ORAL_CAPSULE | Freq: Three times a day (TID) | ORAL | 0 refills | Status: DC | PRN

## 2023-03-23 NOTE — Progress Notes (Signed)
 Virtual Visit Consent   [name], you are scheduled for a virtual visit with a [facility] provider today. Just as with appointments in the office, your consent must be obtained to participate. Your consent will be active for this visit and any virtual visit you may have with one of our providers in the next 365 days. If you have a MyChart account, a copy of this consent can be sent to you electronically.  As this is a virtual visit, video technology does not allow for your provider to perform a traditional examination. This may limit your provider's ability to fully assess your condition. If your provider identifies any concerns that need to be evaluated in person or the need to arrange testing (such as labs, EKG, etc.), we will make arrangements to do so. Although advances in technology are sophisticated, we cannot ensure that it will always work on either your end or our end. If the connection with a video visit is poor, the visit may have to be switched to a telephone visit. With either a video or telephone visit, we are not always able to ensure that we have a secure connection.  By engaging in this virtual visit, you consent to the provision of healthcare and authorize for your insurance to be billed (if applicable) for the services provided during this visit. Depending on your insurance coverage, you may receive a charge related to this service.  I need to obtain your verbal consent now. Are you willing to proceed with your visit today? [name] has provided verbal consent on 03/23/2023 for a virtual visit (video or telephone). Harlene PEDLAR Ward, PA-C  Date: 03/23/2023 11:58 AM  Virtual Visit via Video Note   I, Harlene PEDLAR Ward, connected with  [name]  ([id number], 08/25/1945) on 03/23/23 at 10:45 AM EST by a video-enabled telemedicine application and verified that I am speaking with the correct person using two identifiers.  Location: Patient: Virtual Visit Location Patient:  Home Provider: Virtual Visit Location Provider: Home Office   I discussed the limitations of evaluation and management by telemedicine and the availability of in person appointments. The patient expressed understanding and agreed to proceed.    History of Present Illness: [name] is a 78 y.o. who identifies as a female who was assigned female at birth, and is being seen today for sore throat, fever, headache, congestion that started about 4 days ago.  Denies shortness of breath or wheezing.  Denies h/o asthma or lung disease.  She has been taking mucinex flu and cough which is helping some.  Denies sick contacts. She did not have her flu shot.    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    Allergies:  Allergies  Allergen Reactions   Yellow Jacket Venom [Bee Venom] Rash   Medications:  Current Outpatient Medications:    benzonatate  (TESSALON ) 100 MG capsule, Take 1 capsule (100 mg total) by mouth 3 (three) times daily as needed., Disp: 20 capsule, Rfl: 0   EPINEPHrine  0.3 mg/0.3 mL IJ SOAJ injection, Inject 0.3 mLs (0.3 mg total) into the muscle as needed for up to 1 dose for anaphylaxis., Disp: 1 each, Rfl: 0   losartan -hydrochlorothiazide (HYZAAR) 50-12.5 MG tablet, TAKE 1 TABLET BY MOUTH EVERY DAY, Disp: 90 tablet, Rfl: 1  sertraline  (ZOLOFT ) 25 MG tablet, TAKE 1 TABLET BY MOUTH EVERY DAY, Disp: 90 tablet, Rfl: 3   tobramycin-dexamethasone  (TOBRADEX) ophthalmic solution, SMARTSIG:In Eye(s), Disp: , Rfl:   Observations/Objective: Patient is well-developed, well-nourished in no acute distress.  Resting comfortably at home.  Head is normocephalic, atraumatic.  No labored breathing.  Speech is clear and coherent with logical content.  Patient is alert and oriented at baseline.     Assessment and Plan: 1. Flu-like symptoms (Primary)  Recommend supportive care.  Pt in no acute distress.  She is outside of the window to consider tamiflu.  Recommend home COVID test.  In person evaluation precautions discussed.   Follow Up Instructions: I discussed the assessment and treatment plan with the patient. The patient was provided an opportunity to ask questions and all were answered. The patient agreed with the plan and demonstrated an understanding of the instructions.  A copy of instructions were sent to the patient via MyChart unless otherwise noted below.     The patient was advised to call back or seek an in-person evaluation if the symptoms worsen or if the condition fails to improve as anticipated.    Harlene PEDLAR Ward, PA-C

## 2023-03-23 NOTE — Patient Instructions (Signed)
  [name], thank you for joining Harlene PEDLAR Ward, PA-C for today's virtual visit.  While this provider is not your primary care provider (PCP), if your PCP is located in our provider database this encounter information will be shared with them immediately following your visit.   A [name] MyChart account gives you access to today's visit and all your visits, tests, and labs performed at [facility]  click here if you don't have a [facility] MyChart account or go to [email or web address]  Consent: (Patient) [name] provided verbal consent for this virtual visit at the beginning of the encounter.  Current Medications:  Current Outpatient Medications:    benzonatate  (TESSALON ) 100 MG capsule, Take 1 capsule (100 mg total) by mouth 3 (three) times daily as needed., Disp: 20 capsule, Rfl: 0   EPINEPHrine  0.3 mg/0.3 mL IJ SOAJ injection, Inject 0.3 mLs (0.3 mg total) into the muscle as needed for up to 1 dose for anaphylaxis., Disp: 1 each, Rfl: 0   losartan -hydrochlorothiazide (HYZAAR) 50-12.5 MG tablet, TAKE 1 TABLET BY MOUTH EVERY DAY, Disp: 90 tablet, Rfl: 1   sertraline  (ZOLOFT ) 25 MG tablet, TAKE 1 TABLET BY MOUTH EVERY DAY, Disp: 90 tablet, Rfl: 3   tobramycin-dexamethasone  (TOBRADEX) ophthalmic solution, SMARTSIG:In Eye(s), Disp: , Rfl:    Medications ordered in this encounter:  Meds ordered this encounter  Medications   benzonatate  (TESSALON ) 100 MG capsule    Sig: Take 1 capsule (100 mg total) by mouth 3 (three) times daily as needed.    Dispense:  20 capsule    Refill:  0    Supervising Provider:   BLAISE ALEENE Rosales [[id number]]     *If you need refills on other medications prior to your next appointment, please contact your pharmacy*  Follow-Up: Call back or seek an in-person evaluation if the symptoms worsen or if the condition fails to improve as anticipated.  [facility] [phone]  Other Instructions Can take tessalon  as  needed for cough.  Recommend Mucinex as needed for congestion.  Drink plenty of fluids, rest.  If no improvement or symptoms become worse follow up for in person evaluation with your primary care physician or urgent care.    If you have been instructed to have an in-person evaluation today at a local Urgent Care facility, please use the link below. It will take you to a list of all of our available [facility], including address, phone number and hours of operation. Please do not delay care.  [facility]  If you or a family member do not have a primary care provider, use the link below to schedule a visit and establish care. When you choose a [facility] primary care physician or advanced practice provider, you gain a long-term partner in health. Find a Primary Care Provider  Learn more about [facility] in-office and virtual care options:  - Get Care Now

## 2023-04-21 ENCOUNTER — Ambulatory Visit (INDEPENDENT_AMBULATORY_CARE_PROVIDER_SITE_OTHER): Payer: BC Managed Care – PPO | Admitting: Emergency Medicine

## 2023-04-21 ENCOUNTER — Encounter: Payer: Self-pay | Admitting: Emergency Medicine

## 2023-04-21 DIAGNOSIS — I10 Essential (primary) hypertension: Secondary | ICD-10-CM | POA: Diagnosis not present

## 2023-04-21 DIAGNOSIS — F4010 Social phobia, unspecified: Secondary | ICD-10-CM

## 2023-04-21 MED ORDER — SERTRALINE HCL 25 MG PO TABS
25.0000 mg | ORAL_TABLET | Freq: Every day | ORAL | 3 refills | Status: AC
Start: 2023-04-21 — End: ?

## 2023-04-21 MED ORDER — LOSARTAN POTASSIUM-HCTZ 50-12.5 MG PO TABS
1.0000 | ORAL_TABLET | Freq: Every day | ORAL | 3 refills | Status: AC
Start: 2023-04-21 — End: ?

## 2023-04-21 NOTE — Progress Notes (Signed)
 [name] 78 y.o.   Chief Complaint  Patient presents with   Medical Management of Chronic Issues    6 month follow up    HISTORY OF PRESENT ILLNESS: This is a 77 y.o. female here for 91-month follow-up of chronic medical conditions including hypertension Overall doing well.  Has no complaints or medical concerns today.  Needs medication refills. Normal blood work done last September.  Report reviewed with patient.  HPI   Prior to Admission medications   Medication Sig Start Date End Date Taking? Authorizing Provider  EPINEPHrine 0.3 mg/0.3 mL IJ SOAJ injection Inject 0.3 mLs (0.3 mg total) into the muscle as needed for up to 1 dose for anaphylaxis. 09/16/19  Yes Soto, Leonie Douglas, PA-C  tobramycin-dexamethasone (TOBRADEX) ophthalmic solution SMARTSIG:In Eye(s) 03/07/21  Yes [provider]  losartan-hydrochlorothiazide (HYZAAR) 50-12.5 MG tablet Take 1 tablet by mouth daily. 04/21/23   Georgina Quint, MD  sertraline (ZOLOFT) 25 MG tablet Take 1 tablet (25 mg total) by mouth daily. 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    Past Medical History:  Diagnosis Date   Cystocele with prolapse    VAULT PROLAPSE   History of kidney stones    Macular degeneration BOTH EYES    Past Surgical History:  Procedure Laterality Date   BREAST ENHANCEMENT SURGERY  2007   CYSTOCELE REPAIR N/A 12/09/2017   Procedure: ANTERIOR REPAIR (CYSTOCELE);  Surgeon: Alfredo Martinez, MD;  Location: [facility];  Service: Urology;  Laterality: N/A;   CYSTOSCOPY N/A 12/09/2017   Procedure: CYSTOSCOPY;  Surgeon: Alfredo Martinez, MD;  Location: [facility];  Service: Urology;   Laterality: N/A;   NECK MASS EXCISION  2012   TUBAL LIGATION  1984   VAGINAL HYSTERECTOMY  2007   PARTIAL, AND BLADDER TACH DONE   VAGINAL PROLAPSE REPAIR N/A 12/09/2017   Procedure: VAGINAL VAULT PROLAPSE REPAIR WITH GRAFT;  Surgeon: Alfredo Martinez, MD;  Location: [facility];  Service: Urology;  Laterality: N/A;    Social History   Socioeconomic History   Marital status: Married    Spouse name: Not on file   Number of children: Not on file   Years of education: Not on file   Highest education level: Not on file  Occupational History   Not on file  Tobacco Use   Smoking status: Never   Smokeless tobacco: Never  Vaping Use   Vaping status: Never Used  Substance and Sexual Activity   Alcohol use: No   Drug use: No   Sexual activity: Not on file  Other Topics Concern   Not on file  Social History Narrative   Not on file   Social Drivers of Health   Financial Resource Strain: Low Risk  (04/17/2023)   Overall Financial Resource Strain (CARDIA)    Difficulty of Paying Living Expenses: Not hard at all  Food Insecurity: No Food Insecurity (04/17/2023)   Hunger Vital Sign    Worried About Running Out of Food in the Last Year: Never true    Ran Out of Food in the Last Year: Never true  Transportation Needs: Unknown (04/17/2023)   PRAPARE - Transportation  Lack of Transportation (Medical): No    Lack of Transportation (Non-Medical): Not on file  Physical Activity: Insufficiently Active (04/17/2023)   Exercise Vital Sign    Days of Exercise per Week: 5 days    Minutes of Exercise per Session: 20 min  Stress: No Stress Concern Present (04/17/2023)   [employer] of Occupational Health - Occupational Stress Questionnaire    Feeling of Stress : Not at all  Social Connections: Moderately Integrated (04/17/2023)   Social Connection and Isolation Panel [NHANES]    Frequency of Communication with Friends and Family: More than three times a week    Frequency of Social Gatherings with  Friends and Family: Twice a week    Attends Religious Services: More than 4 times per year    Active Member of [employer] or Organizations: No    Attends Engineer, structural: Not on file    Marital Status: Married  Intimate Partner Violence: Unknown (05/18/2021)   Received from [employer], [facility]   HITS    Physically Hurt: Not on file    Insult or Talk Down To: Not on file    Threaten Physical Harm: Not on file    Scream or Curse: Not on file    Family History  Problem Relation Age of Onset   Stroke Father    Colon cancer Neg Hx      Review of Systems  Constitutional: Negative.  Negative for chills and fever.  HENT: Negative.  Negative for congestion and sore throat.   Respiratory: Negative.  Negative for cough and shortness of breath.   Cardiovascular: Negative.  Negative for chest pain and palpitations.  Gastrointestinal:  Negative for abdominal pain, diarrhea, nausea and vomiting.  Genitourinary: Negative.  Negative for dysuria and hematuria.  Skin: Negative.  Negative for rash.  Neurological: Negative.  Negative for dizziness and headaches.  All other systems reviewed and are negative.   Vitals:   04/21/23 1001  BP: 126/80  Pulse: 88  Temp: 98.6 F (37 C)  SpO2: 95%    Physical Exam Vitals reviewed.  Constitutional:      Appearance: Normal appearance.  HENT:     Head: Normocephalic.     Mouth/Throat:     Mouth: Mucous membranes are moist.     Pharynx: Oropharynx is clear.  Eyes:     Extraocular Movements: Extraocular movements intact.     Conjunctiva/sclera: Conjunctivae normal.     Pupils: Pupils are equal, round, and reactive to light.  Cardiovascular:     Rate and Rhythm: Normal rate and regular rhythm.     Pulses: Normal pulses.     Heart sounds: Normal heart sounds.  Pulmonary:     Effort: Pulmonary effort is normal.     Breath sounds: Normal breath sounds.  Musculoskeletal:     Cervical back: No tenderness.  Lymphadenopathy:      Cervical: No cervical adenopathy.  Skin:    General: Skin is warm and dry.     Capillary Refill: Capillary refill takes less than 2 seconds.  Neurological:     General: No focal deficit present.     Mental Status: She is alert and oriented to person, place, and time.  Psychiatric:        Mood and Affect: Mood normal.        Behavior: Behavior normal.      ASSESSMENT & PLAN: A total of 42 minutes was spent with the patient and counseling/coordination of care regarding preparing for this  visit, review of most recent office visit notes, review of multiple chronic medical conditions and their management, cardiovascular risks associated with hypertension, review of all medications, review of most recent bloodwork results, review of health maintenance items, mental health, education on nutrition, prognosis, documentation, and need for follow up.   Problem List Items Addressed This Visit       Cardiovascular and Mediastinum   Essential hypertension   Well-controlled hypertension with normal blood pressure readings at home Continue Hyzaar 50-12.5 mg daily Cardiovascular risks associated with hypertension discussed. Dietary approaches to stop hypertension discussed Recommend follow-up in 6 months      Relevant Medications   losartan-hydrochlorothiazide (HYZAAR) 50-12.5 MG tablet     Other   Social anxiety disorder   Very well-controlled.  No concerns. Continue sertraline 25 mg daily Mental health discussed      Relevant Medications   sertraline (ZOLOFT) 25 MG tablet   Patient Instructions  Health Maintenance After Age 76 After age 62, you are at a higher risk for certain long-term diseases and infections as well as injuries from falls. Falls are a major cause of broken bones and head injuries in people who are older than age 61. Getting regular preventive care can help to keep you healthy and well. Preventive care includes getting regular testing and making lifestyle changes as  recommended by your health care provider. Talk with your health care provider about: Which screenings and tests you should have. A screening is a test that checks for a disease when you have no symptoms. A diet and exercise plan that is right for you. What should I know about screenings and tests to prevent falls? Screening and testing are the best ways to find a health problem early. Early diagnosis and treatment give you the best chance of managing medical conditions that are common after age 23. Certain conditions and lifestyle choices may make you more likely to have a fall. Your health care provider may recommend: Regular vision checks. Poor vision and conditions such as cataracts can make you more likely to have a fall. If you wear glasses, make sure to get your prescription updated if your vision changes. Medicine review. Work with your health care provider to regularly review all of the medicines you are taking, including over-the-counter medicines. Ask your health care provider about any side effects that may make you more likely to have a fall. Tell your health care provider if any medicines that you take make you feel dizzy or sleepy. Strength and balance checks. Your health care provider may recommend certain tests to check your strength and balance while standing, walking, or changing positions. Foot health exam. Foot pain and numbness, as well as not wearing proper footwear, can make you more likely to have a fall. Screenings, including: Osteoporosis screening. Osteoporosis is a condition that causes the bones to get weaker and break more easily. Blood pressure screening. Blood pressure changes and medicines to control blood pressure can make you feel dizzy. Depression screening. You may be more likely to have a fall if you have a fear of falling, feel depressed, or feel unable to do activities that you used to do. Alcohol use screening. Using too much alcohol can affect your balance and  may make you more likely to have a fall. Follow these instructions at home: Lifestyle Do not drink alcohol if: Your health care provider tells you not to drink. If you drink alcohol: Limit how much you have to: 0-1 drink a day for  women. 0-2 drinks a day for men. Know how much alcohol is in your drink. In the [place] one drink equals one 12 oz bottle of beer (355 mL), one 5 oz glass of wine (148 mL), or one 1 oz glass of hard liquor (44 mL). Do not use any products that contain nicotine or tobacco. These products include cigarettes, chewing tobacco, and vaping devices, such as e-cigarettes. If you need help quitting, ask your health care provider. Activity  Follow a regular exercise program to stay fit. This will help you maintain your balance. Ask your health care provider what types of exercise are appropriate for you. If you need a cane or walker, use it as recommended by your health care provider. Wear supportive shoes that have nonskid soles. Safety  Remove any tripping hazards, such as rugs, cords, and clutter. Install safety equipment such as grab bars in bathrooms and safety rails on stairs. Keep rooms and walkways well-lit. General instructions Talk with your health care provider about your risks for falling. Tell your health care provider if: You fall. Be sure to tell your health care provider about all falls, even ones that seem minor. You feel dizzy, tiredness (fatigue), or off-balance. Take over-the-counter and prescription medicines only as told by your health care provider. These include supplements. Eat a healthy diet and maintain a healthy weight. A healthy diet includes low-fat dairy products, low-fat (lean) meats, and fiber from whole grains, beans, and lots of fruits and vegetables. Stay current with your vaccines. Schedule regular health, dental, and eye exams. Summary Having a healthy lifestyle and getting preventive care can help to protect your health and wellness  after age 14. Screening and testing are the best way to find a health problem early and help you avoid having a fall. Early diagnosis and treatment give you the best chance for managing medical conditions that are more common for people who are older than age 71. Falls are a major cause of broken bones and head injuries in people who are older than age 73. Take precautions to prevent a fall at home. Work with your health care provider to learn what changes you can make to improve your health and wellness and to prevent falls. This information is not intended to replace advice given to you by your health care provider. Make sure you discuss any questions you have with your health care provider. Document Revised: 06/19/2020 Document Reviewed: 06/19/2020 Elsevier Patient Education  2024 Elsevier Inc.    Edwina Barth, MD [facility]

## 2023-04-21 NOTE — Assessment & Plan Note (Signed)
 Well-controlled hypertension with normal blood pressure readings at home Continue Hyzaar 50-12.5 mg daily Cardiovascular risks associated with hypertension discussed. Dietary approaches to stop hypertension discussed Recommend follow-up in 6 months

## 2023-04-21 NOTE — Assessment & Plan Note (Signed)
 Very well-controlled.  No concerns. Continue sertraline 25 mg daily Mental health discussed

## 2023-04-21 NOTE — Patient Instructions (Signed)
 Health Maintenance After Age 76 After age 4, you are at a higher risk for certain long-term diseases and infections as well as injuries from falls. Falls are a major cause of broken bones and head injuries in people who are older than age 47. Getting regular preventive care can help to keep you healthy and well. Preventive care includes getting regular testing and making lifestyle changes as recommended by your health care provider. Talk with your health care provider about: Which screenings and tests you should have. A screening is a test that checks for a disease when you have no symptoms. A diet and exercise plan that is right for you. What should I know about screenings and tests to prevent falls? Screening and testing are the best ways to find a health problem early. Early diagnosis and treatment give you the best chance of managing medical conditions that are common after age 37. Certain conditions and lifestyle choices may make you more likely to have a fall. Your health care provider may recommend: Regular vision checks. Poor vision and conditions such as cataracts can make you more likely to have a fall. If you wear glasses, make sure to get your prescription updated if your vision changes. Medicine review. Work with your health care provider to regularly review all of the medicines you are taking, including over-the-counter medicines. Ask your health care provider about any side effects that may make you more likely to have a fall. Tell your health care provider if any medicines that you take make you feel dizzy or sleepy. Strength and balance checks. Your health care provider may recommend certain tests to check your strength and balance while standing, walking, or changing positions. Foot health exam. Foot pain and numbness, as well as not wearing proper footwear, can make you more likely to have a fall. Screenings, including: Osteoporosis screening. Osteoporosis is a condition that causes  the bones to get weaker and break more easily. Blood pressure screening. Blood pressure changes and medicines to control blood pressure can make you feel dizzy. Depression screening. You may be more likely to have a fall if you have a fear of falling, feel depressed, or feel unable to do activities that you used to do. Alcohol use screening. Using too much alcohol can affect your balance and may make you more likely to have a fall. Follow these instructions at home: Lifestyle Do not drink alcohol if: Your health care provider tells you not to drink. If you drink alcohol: Limit how much you have to: 0-1 drink a day for women. 0-2 drinks a day for men. Know how much alcohol is in your drink. In the [place] one drink equals one 12 oz bottle of beer (355 mL), one 5 oz glass of wine (148 mL), or one 1 oz glass of hard liquor (44 mL). Do not use any products that contain nicotine or tobacco. These products include cigarettes, chewing tobacco, and vaping devices, such as e-cigarettes. If you need help quitting, ask your health care provider. Activity  Follow a regular exercise program to stay fit. This will help you maintain your balance. Ask your health care provider what types of exercise are appropriate for you. If you need a cane or walker, use it as recommended by your health care provider. Wear supportive shoes that have nonskid soles. Safety  Remove any tripping hazards, such as rugs, cords, and clutter. Install safety equipment such as grab bars in bathrooms and safety rails on stairs. Keep rooms and walkways  well-lit. General instructions Talk with your health care provider about your risks for falling. Tell your health care provider if: You fall. Be sure to tell your health care provider about all falls, even ones that seem minor. You feel dizzy, tiredness (fatigue), or off-balance. Take over-the-counter and prescription medicines only as told by your health care provider. These include  supplements. Eat a healthy diet and maintain a healthy weight. A healthy diet includes low-fat dairy products, low-fat (lean) meats, and fiber from whole grains, beans, and lots of fruits and vegetables. Stay current with your vaccines. Schedule regular health, dental, and eye exams. Summary Having a healthy lifestyle and getting preventive care can help to protect your health and wellness after age 11. Screening and testing are the best way to find a health problem early and help you avoid having a fall. Early diagnosis and treatment give you the best chance for managing medical conditions that are more common for people who are older than age 28. Falls are a major cause of broken bones and head injuries in people who are older than age 48. Take precautions to prevent a fall at home. Work with your health care provider to learn what changes you can make to improve your health and wellness and to prevent falls. This information is not intended to replace advice given to you by your health care provider. Make sure you discuss any questions you have with your health care provider. Document Revised: 06/19/2020 Document Reviewed: 06/19/2020 Elsevier Patient Education  2024 [employer].

## 2023-10-22 ENCOUNTER — Ambulatory Visit: Admitting: Emergency Medicine

## 2023-10-27 ENCOUNTER — Ambulatory Visit: Admitting: Emergency Medicine

## 2023-10-27 ENCOUNTER — Ambulatory Visit: Payer: Self-pay | Admitting: Emergency Medicine

## 2023-10-27 ENCOUNTER — Encounter: Payer: Self-pay | Admitting: Emergency Medicine

## 2023-10-27 VITALS — BP 128/74 | HR 67 | Temp 98.2°F | Ht 64.5 in | Wt 154.0 lb

## 2023-10-27 DIAGNOSIS — G250 Essential tremor: Secondary | ICD-10-CM | POA: Diagnosis not present

## 2023-10-27 DIAGNOSIS — R7303 Prediabetes: Secondary | ICD-10-CM | POA: Insufficient documentation

## 2023-10-27 DIAGNOSIS — E785 Hyperlipidemia, unspecified: Secondary | ICD-10-CM | POA: Insufficient documentation

## 2023-10-27 DIAGNOSIS — I10 Essential (primary) hypertension: Secondary | ICD-10-CM | POA: Diagnosis not present

## 2023-10-27 DIAGNOSIS — F4010 Social phobia, unspecified: Secondary | ICD-10-CM | POA: Diagnosis not present

## 2023-10-27 DIAGNOSIS — N1831 Chronic kidney disease, stage 3a: Secondary | ICD-10-CM | POA: Insufficient documentation

## 2023-10-27 LAB — CBC WITH DIFFERENTIAL/PLATELET
Basophils Absolute: 0.1 K/uL (ref 0.0–0.1)
Basophils Relative: 1.2 % (ref 0.0–3.0)
Eosinophils Absolute: 0.1 K/uL (ref 0.0–0.7)
Eosinophils Relative: 2.5 % (ref 0.0–5.0)
HCT: 39.4 % (ref 36.0–46.0)
Hemoglobin: 13 g/dL (ref 12.0–15.0)
Lymphocytes Relative: 37.6 % (ref 12.0–46.0)
Lymphs Abs: 2.1 K/uL (ref 0.7–4.0)
MCHC: 33.1 g/dL (ref 30.0–36.0)
MCV: 92.6 fl (ref 78.0–100.0)
Monocytes Absolute: 0.6 K/uL (ref 0.1–1.0)
Monocytes Relative: 10.5 % (ref 3.0–12.0)
Neutro Abs: 2.6 K/uL (ref 1.4–7.7)
Neutrophils Relative %: 48.2 % (ref 43.0–77.0)
Platelets: 244 K/uL (ref 150.0–400.0)
RBC: 4.25 Mil/uL (ref 3.87–5.11)
RDW: 12.9 % (ref 11.5–15.5)
WBC: 5.5 K/uL (ref 4.0–10.5)

## 2023-10-27 LAB — COMPREHENSIVE METABOLIC PANEL WITH GFR
ALT: 19 U/L (ref 0–35)
AST: 46 U/L — ABNORMAL HIGH (ref 0–37)
Albumin: 4.3 g/dL (ref 3.5–5.2)
Alkaline Phosphatase: 60 U/L (ref 39–117)
BUN: 18 mg/dL (ref 6–23)
CO2: 27 meq/L (ref 19–32)
Calcium: 9.5 mg/dL (ref 8.4–10.5)
Chloride: 105 meq/L (ref 96–112)
Creatinine, Ser: 0.99 mg/dL (ref 0.40–1.20)
GFR: 55.66 mL/min — ABNORMAL LOW (ref >60.00)
Glucose, Bld: 77 mg/dL (ref 70–99)
Potassium: 4.1 meq/L (ref 3.5–5.1)
Sodium: 140 meq/L (ref 135–145)
Total Bilirubin: 0.4 mg/dL (ref 0.2–1.2)
Total Protein: 7.2 g/dL (ref 6.0–8.3)

## 2023-10-27 LAB — LIPID PANEL
Cholesterol: 205 mg/dL — ABNORMAL HIGH (ref 0–200)
HDL: 52.1 mg/dL (ref >39.00)
LDL Cholesterol: 119 mg/dL — ABNORMAL HIGH (ref 0–99)
NonHDL: 152.94
Total CHOL/HDL Ratio: 4
Triglycerides: 172 mg/dL — ABNORMAL HIGH (ref 0.0–149.0)
VLDL: 34.4 mg/dL (ref 0.0–40.0)

## 2023-10-27 LAB — HEMOGLOBIN A1C: Hgb A1c MFr Bld: 6.3 % (ref 4.6–6.5)

## 2023-10-27 MED ORDER — ROSUVASTATIN CALCIUM 10 MG PO TABS: 10.0000 mg | ORAL_TABLET | Freq: Every day | ORAL | 3 refills | Status: AC

## 2023-10-27 MED ORDER — PROPRANOLOL HCL 20 MG PO TABS
20.0000 mg | ORAL_TABLET | Freq: Two times a day (BID) | ORAL | 1 refills | Status: AC
Start: 2023-10-27 — End: ?

## 2023-10-27 MED ORDER — DAPAGLIFLOZIN PROPANEDIOL 10 MG PO TABS: 10.0000 mg | ORAL_TABLET | Freq: Every day | ORAL | 3 refills | Status: AC

## 2023-10-27 NOTE — Patient Instructions (Signed)
 Health Maintenance After Age 76 After age 27, you are at a higher risk for certain long-term diseases and infections as well as injuries from falls. Falls are a major cause of broken bones and head injuries in people who are older than age 73. Getting regular preventive care can help to keep you healthy and well. Preventive care includes getting regular testing and making lifestyle changes as recommended by your health care provider. Talk with your health care provider about: Which screenings and tests you should have. A screening is a test that checks for a disease when you have no symptoms. A diet and exercise plan that is right for you. What should I know about screenings and tests to prevent falls? Screening and testing are the best ways to find a health problem early. Early diagnosis and treatment give you the best chance of managing medical conditions that are common after age 90. Certain conditions and lifestyle choices may make you more likely to have a fall. Your health care provider may recommend: Regular vision checks. Poor vision and conditions such as cataracts can make you more likely to have a fall. If you wear glasses, make sure to get your prescription updated if your vision changes. Medicine review. Work with your health care provider to regularly review all of the medicines you are taking, including over-the-counter medicines. Ask your health care provider about any side effects that may make you more likely to have a fall. Tell your health care provider if any medicines that you take make you feel dizzy or sleepy. Strength and balance checks. Your health care provider may recommend certain tests to check your strength and balance while standing, walking, or changing positions. Foot health exam. Foot pain and numbness, as well as not wearing proper footwear, can make you more likely to have a fall. Screenings, including: Osteoporosis screening. Osteoporosis is a condition that causes  the bones to get weaker and break more easily. Blood pressure screening. Blood pressure changes and medicines to control blood pressure can make you feel dizzy. Depression screening. You may be more likely to have a fall if you have a fear of falling, feel depressed, or feel unable to do activities that you used to do. Alcohol  use screening. Using too much alcohol  can affect your balance and may make you more likely to have a fall. Follow these instructions at home: Lifestyle Do not drink alcohol  if: Your health care provider tells you not to drink. If you drink alcohol : Limit how much you have to: 0-1 drink a day for women. 0-2 drinks a day for men. Know how much alcohol  is in your drink. In the [place] one drink equals one 12 oz bottle of beer (355 mL), one 5 oz glass of wine (148 mL), or one 1 oz glass of hard liquor (44 mL). Do not use any products that contain nicotine or tobacco. These products include cigarettes, chewing tobacco, and vaping devices, such as e-cigarettes. If you need help quitting, ask your health care provider. Activity  Follow a regular exercise program to stay fit. This will help you maintain your balance. Ask your health care provider what types of exercise are appropriate for you. If you need a cane or walker, use it as recommended by your health care provider. Wear supportive shoes that have nonskid soles. Safety  Remove any tripping hazards, such as rugs, cords, and clutter. Install safety equipment such as grab bars in bathrooms and safety rails on stairs. Keep rooms and walkways  well-lit. General instructions Talk with your health care provider about your risks for falling. Tell your health care provider if: You fall. Be sure to tell your health care provider about all falls, even ones that seem minor. You feel dizzy, tiredness (fatigue), or off-balance. Take over-the-counter and prescription medicines only as told by your health care provider. These include  supplements. Eat a healthy diet and maintain a healthy weight. A healthy diet includes low-fat dairy products, low-fat (lean) meats, and fiber from whole grains, beans, and lots of fruits and vegetables. Stay current with your vaccines. Schedule regular health, dental, and eye exams. Summary Having a healthy lifestyle and getting preventive care can help to protect your health and wellness after age 15. Screening and testing are the best way to find a health problem early and help you avoid having a fall. Early diagnosis and treatment give you the best chance for managing medical conditions that are more common for people who are older than age 42. Falls are a major cause of broken bones and head injuries in people who are older than age 64. Take precautions to prevent a fall at home. Work with your health care provider to learn what changes you can make to improve your health and wellness and to prevent falls. This information is not intended to replace advice given to you by your health care provider. Make sure you discuss any questions you have with your health care provider. Document Revised: 06/19/2020 Document Reviewed: 06/19/2020 Elsevier Patient Education  2024 [employer].

## 2023-10-27 NOTE — Progress Notes (Signed)
 [name] 78 y.o.   Chief Complaint  Patient presents with   Medical Management of Chronic Issues    Talk about medication for her tremors     HISTORY OF PRESENT ILLNESS: This is a 78 y.o. female here for follow-up of chronic medical conditions Also complaining of essential tremors for the last 10 years.  Interested in medication.  Inquiring about propranolol . No other complaints or medical concerns today.  HPI   Prior to Admission medications   Medication Sig Start Date End Date Taking? Authorizing Provider  EPINEPHrine  0.3 mg/0.3 mL IJ SOAJ injection Inject 0.3 mLs (0.3 mg total) into the muscle as needed for up to 1 dose for anaphylaxis. 09/16/19  Yes Soto, Johana, PA-C  losartan -hydrochlorothiazide (HYZAAR) 50-12.5 MG tablet Take 1 tablet by mouth daily. 04/21/23  Yes Petina Muraski, Emil Schanz, MD  sertraline  (ZOLOFT ) 25 MG tablet Take 1 tablet (25 mg total) by mouth daily. 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    Past Medical History:  Diagnosis Date   Cystocele with prolapse    VAULT PROLAPSE   History of kidney stones    Macular degeneration BOTH EYES    Past Surgical History:  Procedure Laterality Date   BREAST ENHANCEMENT SURGERY  2007   CYSTOCELE REPAIR N/A 12/09/2017   Procedure: ANTERIOR REPAIR (CYSTOCELE);  Surgeon: Gaston Hamilton, MD;  Location: [facility];  Service: Urology;  Laterality: N/A;   CYSTOSCOPY N/A 12/09/2017   Procedure: CYSTOSCOPY;  Surgeon: Gaston Hamilton, MD;  Location: [facility];  Service: Urology;   Laterality: N/A;   NECK MASS EXCISION  2012   TUBAL LIGATION  1984   VAGINAL HYSTERECTOMY  2007   PARTIAL, AND BLADDER TACH DONE   VAGINAL PROLAPSE REPAIR N/A 12/09/2017   Procedure: VAGINAL VAULT PROLAPSE REPAIR WITH GRAFT;  Surgeon: Gaston Hamilton, MD;  Location: [facility];  Service: Urology;  Laterality: N/A;    Social History   Socioeconomic History   Marital status: Married    Spouse name: Not on file   Number of children: Not on file   Years of education: Not on file   Highest education level: Associate degree: occupational, Scientist, product/process development, or vocational program  Occupational History   Not on file  Tobacco Use   Smoking status: Never   Smokeless tobacco: Never  Vaping Use   Vaping status: Never Used  Substance and Sexual Activity   Alcohol use: No   Drug use: No   Sexual activity: Not on file  Other Topics Concern   Not on file  Social History Narrative   Not on file   Social Drivers of Health   Financial Resource Strain: Low Risk  (10/26/2023)   Overall Financial Resource Strain (CARDIA)    Difficulty of Paying Living Expenses: Not hard at all  Food Insecurity: No Food Insecurity (10/26/2023)   Hunger Vital Sign    Worried About Running Out of Food in the Last Year: Never true    Ran Out of Food in the Last Year: Never true  Transportation Needs: No Transportation Needs (10/26/2023)   PRAPARE -  Administrator, Civil Service (Medical): No    Lack of Transportation (Non-Medical): No  Physical Activity: Sufficiently Active (10/26/2023)   Exercise Vital Sign    Days of Exercise per Week: 7 days    Minutes of Exercise per Session: 60 min  Stress: No Stress Concern Present (10/26/2023)   [employer] of Occupational Health - Occupational Stress Questionnaire    Feeling of Stress: Not at all  Social Connections: Moderately Integrated (10/26/2023)   Social Connection and Isolation Panel    Frequency of Communication with Friends and Family: More than three  times a week    Frequency of Social Gatherings with Friends and Family: More than three times a week    Attends Religious Services: More than 4 times per year    Active Member of [employer] or Organizations: No    Attends Banker Meetings: Not on file    Marital Status: Married  Intimate Partner Violence: Unknown (05/18/2021)   Received from [facility]   HITS    Physically Hurt: Not on file    Insult or Talk Down To: Not on file    Threaten Physical Harm: Not on file    Scream or Curse: Not on file    Family History  Problem Relation Age of Onset   Stroke Father    Colon cancer Neg Hx      Review of Systems  Constitutional: Negative.  Negative for chills and fever.  HENT: Negative.  Negative for congestion and sore throat.   Respiratory: Negative.  Negative for cough and shortness of breath.   Cardiovascular: Negative.  Negative for chest pain and palpitations.  Gastrointestinal:  Negative for abdominal pain, diarrhea, nausea and vomiting.  Genitourinary: Negative.  Negative for dysuria and hematuria.  Skin: Negative.  Negative for rash.  Neurological:  Positive for tremors. Negative for dizziness and headaches.  All other systems reviewed and are negative.   Vitals:   10/27/23 1059  BP: 128/74  Pulse: 67  Temp: 98.2 F (36.8 C)  SpO2: 95%    Physical Exam Vitals reviewed.  Constitutional:      Appearance: Normal appearance.  HENT:     Head: Normocephalic.     Mouth/Throat:     Mouth: Mucous membranes are moist.     Pharynx: Oropharynx is clear.  Eyes:     Extraocular Movements: Extraocular movements intact.     Pupils: Pupils are equal, round, and reactive to light.  Cardiovascular:     Rate and Rhythm: Normal rate and regular rhythm.     Pulses: Normal pulses.     Heart sounds: Normal heart sounds.  Pulmonary:     Effort: Pulmonary effort is normal.     Breath sounds: Normal breath sounds.  Musculoskeletal:     Cervical back: No tenderness.   Lymphadenopathy:     Cervical: No cervical adenopathy.  Skin:    General: Skin is warm and dry.     Capillary Refill: Capillary refill takes less than 2 seconds.  Neurological:     General: No focal deficit present.     Mental Status: She is alert and oriented to person, place, and time.     Comments: Bilateral tremors  Psychiatric:        Mood and Affect: Mood normal.        Behavior: Behavior normal.      ASSESSMENT & PLAN: A total of 40 minutes was spent with the patient and counseling/coordination of care regarding  preparing for this visit, review of most recent office visit notes, review of multiple chronic medical conditions and their management, management and treatment of essential tremors, review of all medications and new one, review of most recent bloodwork results, review of health maintenance items, education on nutrition, prognosis, documentation, and need for follow up.   Problem List Items Addressed This Visit       Cardiovascular and Mediastinum   Essential hypertension - Primary   BP Readings from Last 3 Encounters:  10/27/23 128/74  04/21/23 126/80  10/21/22 130/88  Well-controlled hypertension Continue Hyzaar 50-12.5 mg daily Diet and nutrition discussed Cardiovascular risk factors associated with hypertension discussed       Relevant Medications   propranolol  (INDERAL ) 20 MG tablet   Other Relevant Orders   CBC with Differential/Platelet   Comprehensive metabolic panel with GFR   Hemoglobin A1c   Lipid panel     Nervous and Auditory   Essential tremor   Active and affecting quality of life May benefit from propranolol  20 mg twice a day Advised to monitor blood pressure readings daily for the next several weeks and contact the office if numbers persistently abnormal      Relevant Medications   propranolol  (INDERAL ) 20 MG tablet     Other   Social anxiety disorder   Very well-controlled.  No concerns. Continue sertraline  25 mg  daily Mental health discussed      Patient Instructions  Health Maintenance After Age 26 After age 46, you are at a higher risk for certain long-term diseases and infections as well as injuries from falls. Falls are a major cause of broken bones and head injuries in people who are older than age 80. Getting regular preventive care can help to keep you healthy and well. Preventive care includes getting regular testing and making lifestyle changes as recommended by your health care provider. Talk with your health care provider about: Which screenings and tests you should have. A screening is a test that checks for a disease when you have no symptoms. A diet and exercise plan that is right for you. What should I know about screenings and tests to prevent falls? Screening and testing are the best ways to find a health problem early. Early diagnosis and treatment give you the best chance of managing medical conditions that are common after age 2. Certain conditions and lifestyle choices may make you more likely to have a fall. Your health care provider may recommend: Regular vision checks. Poor vision and conditions such as cataracts can make you more likely to have a fall. If you wear glasses, make sure to get your prescription updated if your vision changes. Medicine review. Work with your health care provider to regularly review all of the medicines you are taking, including over-the-counter medicines. Ask your health care provider about any side effects that may make you more likely to have a fall. Tell your health care provider if any medicines that you take make you feel dizzy or sleepy. Strength and balance checks. Your health care provider may recommend certain tests to check your strength and balance while standing, walking, or changing positions. Foot health exam. Foot pain and numbness, as well as not wearing proper footwear, can make you more likely to have a fall. Screenings,  including: Osteoporosis screening. Osteoporosis is a condition that causes the bones to get weaker and break more easily. Blood pressure screening. Blood pressure changes and medicines to control blood pressure can make you feel dizzy.  Depression screening. You may be more likely to have a fall if you have a fear of falling, feel depressed, or feel unable to do activities that you used to do. Alcohol use screening. Using too much alcohol can affect your balance and may make you more likely to have a fall. Follow these instructions at home: Lifestyle Do not drink alcohol if: Your health care provider tells you not to drink. If you drink alcohol: Limit how much you have to: 0-1 drink a day for women. 0-2 drinks a day for men. Know how much alcohol is in your drink. In the [place] one drink equals one 12 oz bottle of beer (355 mL), one 5 oz glass of wine (148 mL), or one 1 oz glass of hard liquor (44 mL). Do not use any products that contain nicotine or tobacco. These products include cigarettes, chewing tobacco, and vaping devices, such as e-cigarettes. If you need help quitting, ask your health care provider. Activity  Follow a regular exercise program to stay fit. This will help you maintain your balance. Ask your health care provider what types of exercise are appropriate for you. If you need a cane or walker, use it as recommended by your health care provider. Wear supportive shoes that have nonskid soles. Safety  Remove any tripping hazards, such as rugs, cords, and clutter. Install safety equipment such as grab bars in bathrooms and safety rails on stairs. Keep rooms and walkways well-lit. General instructions Talk with your health care provider about your risks for falling. Tell your health care provider if: You fall. Be sure to tell your health care provider about all falls, even ones that seem minor. You feel dizzy, tiredness (fatigue), or off-balance. Take over-the-counter and  prescription medicines only as told by your health care provider. These include supplements. Eat a healthy diet and maintain a healthy weight. A healthy diet includes low-fat dairy products, low-fat (lean) meats, and fiber from whole grains, beans, and lots of fruits and vegetables. Stay current with your vaccines. Schedule regular health, dental, and eye exams. Summary Having a healthy lifestyle and getting preventive care can help to protect your health and wellness after age 58. Screening and testing are the best way to find a health problem early and help you avoid having a fall. Early diagnosis and treatment give you the best chance for managing medical conditions that are more common for people who are older than age 6. Falls are a major cause of broken bones and head injuries in people who are older than age 55. Take precautions to prevent a fall at home. Work with your health care provider to learn what changes you can make to improve your health and wellness and to prevent falls. This information is not intended to replace advice given to you by your health care provider. Make sure you discuss any questions you have with your health care provider. Document Revised: 06/19/2020 Document Reviewed: 06/19/2020 Elsevier Patient Education  2024 Elsevier Inc.    Emil Schaumann, MD [facility]

## 2023-10-27 NOTE — Assessment & Plan Note (Signed)
 Active and affecting quality of life May benefit from propranolol  20 mg twice a day Advised to monitor blood pressure readings daily for the next several weeks and contact the office if numbers persistently abnormal

## 2023-10-27 NOTE — Assessment & Plan Note (Signed)
 BP Readings from Last 3 Encounters:  10/27/23 128/74  04/21/23 126/80  10/21/22 130/88  Well-controlled hypertension Continue Hyzaar 50-12.5 mg daily Diet and nutrition discussed Cardiovascular risk factors associated with hypertension discussed

## 2023-10-27 NOTE — Assessment & Plan Note (Signed)
 Very well-controlled.  No concerns. Continue sertraline 25 mg daily Mental health discussed

## 2024-04-26 ENCOUNTER — Ambulatory Visit: Admitting: Emergency Medicine
